# Patient Record
Sex: Male | Born: 1955
Health system: Southern US, Community
[De-identification: ages and names within clinical notes are randomized; demographics above are authoritative.]

## PROBLEM LIST (undated history)

## (undated) DIAGNOSIS — I739 Peripheral vascular disease, unspecified: Secondary | ICD-10-CM

## (undated) DIAGNOSIS — I10 Essential (primary) hypertension: Secondary | ICD-10-CM

## (undated) DIAGNOSIS — E119 Type 2 diabetes mellitus without complications: Secondary | ICD-10-CM

## (undated) DIAGNOSIS — I6529 Occlusion and stenosis of unspecified carotid artery: Secondary | ICD-10-CM

## (undated) HISTORY — DX: Peripheral vascular disease, unspecified: I73.9

## (undated) HISTORY — DX: Occlusion and stenosis of unspecified carotid artery: I65.29

---

## 2017-01-03 DIAGNOSIS — L0291 Cutaneous abscess, unspecified: Secondary | ICD-10-CM | POA: Diagnosis not present

## 2017-01-03 DIAGNOSIS — L255 Unspecified contact dermatitis due to plants, except food: Secondary | ICD-10-CM | POA: Diagnosis not present

## 2017-01-03 DIAGNOSIS — A932 Colorado tick fever: Secondary | ICD-10-CM | POA: Diagnosis not present

## 2017-01-22 DIAGNOSIS — L259 Unspecified contact dermatitis, unspecified cause: Secondary | ICD-10-CM | POA: Diagnosis not present

## 2017-02-19 DIAGNOSIS — A932 Colorado tick fever: Secondary | ICD-10-CM | POA: Diagnosis not present

## 2017-02-19 DIAGNOSIS — L309 Dermatitis, unspecified: Secondary | ICD-10-CM | POA: Diagnosis not present

## 2017-02-19 DIAGNOSIS — L259 Unspecified contact dermatitis, unspecified cause: Secondary | ICD-10-CM | POA: Diagnosis not present

## 2017-02-25 DIAGNOSIS — D485 Neoplasm of uncertain behavior of skin: Secondary | ICD-10-CM | POA: Diagnosis not present

## 2017-02-25 DIAGNOSIS — L578 Other skin changes due to chronic exposure to nonionizing radiation: Secondary | ICD-10-CM | POA: Diagnosis not present

## 2017-02-25 DIAGNOSIS — L300 Nummular dermatitis: Secondary | ICD-10-CM | POA: Diagnosis not present

## 2017-03-25 DIAGNOSIS — L300 Nummular dermatitis: Secondary | ICD-10-CM | POA: Diagnosis not present

## 2017-03-25 DIAGNOSIS — R531 Weakness: Secondary | ICD-10-CM | POA: Diagnosis not present

## 2017-05-25 DIAGNOSIS — R739 Hyperglycemia, unspecified: Secondary | ICD-10-CM | POA: Diagnosis not present

## 2017-05-25 DIAGNOSIS — Z1389 Encounter for screening for other disorder: Secondary | ICD-10-CM | POA: Diagnosis not present

## 2017-05-25 DIAGNOSIS — R21 Rash and other nonspecific skin eruption: Secondary | ICD-10-CM | POA: Diagnosis not present

## 2017-05-25 DIAGNOSIS — Z79899 Other long term (current) drug therapy: Secondary | ICD-10-CM | POA: Diagnosis not present

## 2017-05-25 DIAGNOSIS — R5383 Other fatigue: Secondary | ICD-10-CM | POA: Diagnosis not present

## 2017-07-05 DIAGNOSIS — H25813 Combined forms of age-related cataract, bilateral: Secondary | ICD-10-CM | POA: Diagnosis not present

## 2017-07-05 DIAGNOSIS — E119 Type 2 diabetes mellitus without complications: Secondary | ICD-10-CM | POA: Diagnosis not present

## 2017-07-29 DIAGNOSIS — Z6822 Body mass index (BMI) 22.0-22.9, adult: Secondary | ICD-10-CM | POA: Diagnosis not present

## 2017-07-29 DIAGNOSIS — L989 Disorder of the skin and subcutaneous tissue, unspecified: Secondary | ICD-10-CM | POA: Diagnosis not present

## 2017-07-29 DIAGNOSIS — E119 Type 2 diabetes mellitus without complications: Secondary | ICD-10-CM | POA: Diagnosis not present

## 2017-08-26 DIAGNOSIS — Z23 Encounter for immunization: Secondary | ICD-10-CM | POA: Diagnosis not present

## 2017-08-26 DIAGNOSIS — M159 Polyosteoarthritis, unspecified: Secondary | ICD-10-CM | POA: Diagnosis not present

## 2017-08-26 DIAGNOSIS — R39198 Other difficulties with micturition: Secondary | ICD-10-CM | POA: Diagnosis not present

## 2017-08-26 DIAGNOSIS — E119 Type 2 diabetes mellitus without complications: Secondary | ICD-10-CM | POA: Diagnosis not present

## 2017-10-24 DIAGNOSIS — Z1211 Encounter for screening for malignant neoplasm of colon: Secondary | ICD-10-CM | POA: Diagnosis not present

## 2017-10-24 DIAGNOSIS — R030 Elevated blood-pressure reading, without diagnosis of hypertension: Secondary | ICD-10-CM | POA: Diagnosis not present

## 2017-10-24 DIAGNOSIS — E119 Type 2 diabetes mellitus without complications: Secondary | ICD-10-CM | POA: Diagnosis not present

## 2017-12-22 DIAGNOSIS — E785 Hyperlipidemia, unspecified: Secondary | ICD-10-CM | POA: Diagnosis not present

## 2017-12-22 DIAGNOSIS — E119 Type 2 diabetes mellitus without complications: Secondary | ICD-10-CM | POA: Diagnosis not present

## 2017-12-22 DIAGNOSIS — M159 Polyosteoarthritis, unspecified: Secondary | ICD-10-CM | POA: Diagnosis not present

## 2018-01-16 DIAGNOSIS — D124 Benign neoplasm of descending colon: Secondary | ICD-10-CM | POA: Diagnosis not present

## 2018-01-16 DIAGNOSIS — K5730 Diverticulosis of large intestine without perforation or abscess without bleeding: Secondary | ICD-10-CM | POA: Diagnosis not present

## 2018-01-16 DIAGNOSIS — K635 Polyp of colon: Secondary | ICD-10-CM | POA: Diagnosis not present

## 2018-01-16 DIAGNOSIS — Z1211 Encounter for screening for malignant neoplasm of colon: Secondary | ICD-10-CM | POA: Diagnosis not present

## 2018-02-06 ENCOUNTER — Ambulatory Visit (INDEPENDENT_AMBULATORY_CARE_PROVIDER_SITE_OTHER): Payer: Commercial Managed Care - PPO

## 2018-02-06 ENCOUNTER — Telehealth: Payer: Self-pay | Admitting: *Deleted

## 2018-02-06 ENCOUNTER — Encounter: Payer: Self-pay | Admitting: Podiatry

## 2018-02-06 ENCOUNTER — Ambulatory Visit: Payer: Commercial Managed Care - PPO | Admitting: Podiatry

## 2018-02-06 VITALS — BP 149/85 | HR 76

## 2018-02-06 DIAGNOSIS — Z720 Tobacco use: Secondary | ICD-10-CM

## 2018-02-06 DIAGNOSIS — I739 Peripheral vascular disease, unspecified: Secondary | ICD-10-CM

## 2018-02-06 DIAGNOSIS — I96 Gangrene, not elsewhere classified: Secondary | ICD-10-CM | POA: Diagnosis not present

## 2018-02-06 DIAGNOSIS — Z8679 Personal history of other diseases of the circulatory system: Secondary | ICD-10-CM | POA: Diagnosis not present

## 2018-02-06 NOTE — Telephone Encounter (Signed)
-----   Message from Evelina Bucy, DPM sent at 02/06/2018  9:31 AM EDT ----- Regarding: Vascular Studies Can we order vascular studies at [facility]? Including toe pressures. Thanks!

## 2018-02-06 NOTE — Telephone Encounter (Signed)
Left message informing pt of the appt 02/15/2018 and [facility] [phone].

## 2018-02-06 NOTE — Progress Notes (Signed)
  Subjective:  Patient ID: William Shields, male    DOB: [birth year],  MRN: [id number]  Chief Complaint  Patient presents with  . Foot Pain    right foot from great toe up into the calf of leg   62 y.o. male presents with the above complaint.  States that the right great toe is turning dark/purple started to have pain up into his leg for the past year.  The toe is only been darkening for the past week or so.  Reports diabetes with daily blood sugars between 140 and 150.  Denies numbness and tingling.  Endorses a smoking.  Has attempted bupropion and Chantix to quit smoking but has never succeeded.  No past medical history on file.  Current Outpatient Medications:  .  lisinopril (PRINIVIL,ZESTRIL) 2.5 MG tablet, Take 2.5 mg by mouth daily., Disp: , Rfl: 5 .  metFORMIN (GLUCOPHAGE-XR) 500 MG 24 hr tablet, Take 500 mg by mouth 2 (two) times daily., Disp: , Rfl: 5 .  tamsulosin (FLOMAX) 0.4 MG CAPS capsule, , Disp: , Rfl:   Allergies  Allergen Reactions  . Pollen Extract    Review of Systems: Negative except as noted in the HPI. Denies N/V/F/Ch. Objective:   Vitals:   02/06/18 0921  BP: (!) 149/85  Pulse: 76   General AA&O x3. Normal mood and affect.  Vascular Dorsalis pedis and posterior tibial pulses faint.  Foot warm but decreased pressure noted Capillary refill delayed to the right hallux approximately 5 seconds Pedal hair growth diminished.  Neurologic Epicritic sensation grossly present.  Dermatologic No open lesions. Interspaces clear of maceration. Nails well groomed and normal in appearance.  Orthopedic: MMT 5/5 in dorsiflexion, plantarflexion, inversion, and eversion. Normal joint ROM without pain or crepitus.   Assessment & Plan:  Patient was evaluated and treated and all questions answered.  Peripheral arterial disease with intermittent claudication -Order for stat vascular studies. -Recommend baby aspirin daily -Should vascular studies warrant would provide referral  to interventionalist  Tobacco use disorder -3 minutes spent discussing importance of smoking cessation for overall but more importantly pedal health.  Discussed the risk of continued smoking on decreased pedal perfusion and possible limb loss.  Discussed follow-up with PCP for further help with smoking cessation, though he has failed bupropion and Chantix already.  No follow-ups on file.

## 2018-02-06 NOTE — Telephone Encounter (Signed)
[facility] scheduled dopplers for 02/15/2018 arrive 7:30am for 8:00am testing. Faxed orders to [facility].

## 2018-02-09 ENCOUNTER — Telehealth: Payer: Self-pay | Admitting: Podiatry

## 2018-02-15 DIAGNOSIS — I96 Gangrene, not elsewhere classified: Secondary | ICD-10-CM | POA: Diagnosis not present

## 2018-02-15 DIAGNOSIS — I70213 Atherosclerosis of native arteries of extremities with intermittent claudication, bilateral legs: Secondary | ICD-10-CM | POA: Diagnosis not present

## 2018-02-15 DIAGNOSIS — I771 Stricture of artery: Secondary | ICD-10-CM | POA: Diagnosis not present

## 2018-02-16 ENCOUNTER — Encounter: Payer: Self-pay | Admitting: Podiatry

## 2018-02-20 ENCOUNTER — Ambulatory Visit: Payer: Commercial Managed Care - PPO | Admitting: Podiatry

## 2018-02-20 ENCOUNTER — Encounter: Payer: Self-pay | Admitting: *Deleted

## 2018-02-20 DIAGNOSIS — Z720 Tobacco use: Secondary | ICD-10-CM

## 2018-02-20 DIAGNOSIS — I739 Peripheral vascular disease, unspecified: Secondary | ICD-10-CM

## 2018-02-20 NOTE — Progress Notes (Signed)
  Subjective:  Patient ID: William Shields, male    DOB: January 02, 1956,  MRN: [id number]  Chief Complaint  Patient presents with  . PAD    F/U Pt. stated," aspirin keeping my BP under control, but my foot pain is about the same when I do house work; 10/10 achy pain Tx: aspirin   62 y.o. male presents with the above complaint. Tried to quit smoking for 3 days but states his BS and BP went up and he went back to smoking. Pain unchanged.  Allergies  Allergen Reactions  . Pollen Extract    Review of Systems: Negative except as noted in the HPI. Denies N/V/F/Ch. Objective:   There were no vitals filed for this visit. General AA&O x3. Normal mood and affect.  Vascular Dorsalis pedis and posterior tibial pulses faint.  Foot warm but decreased pressure noted Capillary refill delayed to the right hallux approximately 5 seconds Pedal hair growth diminished.  Neurologic Epicritic sensation grossly present.  Dermatologic No open lesions. Interspaces clear of maceration. Nails well groomed and normal in appearance.  Orthopedic: MMT 5/5 in dorsiflexion, plantarflexion, inversion, and eversion. Normal joint ROM without pain or crepitus.   Assessment & Plan:  Patient was evaluated and treated and all questions answered.  Peripheral arterial disease with intermittent claudication -ABIs/PVRs reviewed. SFA disease R. Decreased toe pressures bilat, flatline pressure L -Would benefit from eval for angio. Will refer to Dr. Corrie Mckusick for eval. -F/u in 1 month. -Encouraged continued smoking cessation.   No follow-ups on file.

## 2018-02-21 ENCOUNTER — Other Ambulatory Visit: Payer: Self-pay | Admitting: Podiatry

## 2018-02-21 ENCOUNTER — Telehealth: Payer: Self-pay | Admitting: *Deleted

## 2018-02-21 DIAGNOSIS — R0989 Other specified symptoms and signs involving the circulatory and respiratory systems: Secondary | ICD-10-CM

## 2018-02-21 DIAGNOSIS — I739 Peripheral vascular disease, unspecified: Secondary | ICD-10-CM

## 2018-02-21 NOTE — Telephone Encounter (Signed)
-----   Message from Evelina Bucy, DPM sent at 02/20/2018  8:55 AM EDT ----- Regarding: Referral to Dr. Earleen Newport Can we get pt appt with Dr. Corrie Mckusick for possible Angio?

## 2018-02-21 NOTE — Telephone Encounter (Signed)
lmom to sch consult with Dr Earleen Newport.Cathren Harsh

## 2018-02-21 NOTE — Telephone Encounter (Signed)
[facility]Basket orders to [facility] and faxed to [facility].

## 2018-03-02 ENCOUNTER — Other Ambulatory Visit: Payer: Self-pay | Admitting: *Deleted

## 2018-03-02 ENCOUNTER — Ambulatory Visit
Admission: RE | Admit: 2018-03-02 | Discharge: 2018-03-02 | Disposition: A | Discharge status: Home / Self Care | Payer: Self-pay | Source: Ambulatory Visit | Attending: Podiatry | Admitting: Podiatry

## 2018-03-02 DIAGNOSIS — I739 Peripheral vascular disease, unspecified: Secondary | ICD-10-CM

## 2018-03-02 DIAGNOSIS — R0989 Other specified symptoms and signs involving the circulatory and respiratory systems: Secondary | ICD-10-CM

## 2018-03-02 HISTORY — PX: IR RADIOLOGIST EVAL & MGMT: IMG5224

## 2018-03-02 HISTORY — DX: Peripheral vascular disease, unspecified: I73.9

## 2018-03-02 NOTE — Consult Note (Signed)
Chief Complaint: I have leg pain  Referring Physician(s): Price,Michael J  PCP: Jeanie Sewer, [facility], Internal Medicine in [place]  History of Present Illness: William Shields is a 62 y.o. male presenting today as a scheduled consultation to Vascular & Interventional Radiology, kindly referred by Dr. Hardie Pulley of [facility], for evaluation of symptomatic PAD.    William Shields is here today by himself. He tells me that he has had worsening right leg pain over the course of several months, which started around December.  He also has noticed that his right great toe has been changing color, specifically purplish-black. This is what brought him to Dr. Eleanora Neighbor office.    He has not had any wound of the legs.  His left leg symptoms of pain are more mild.  His right leg symptoms are present with any short distance walking, and include very painful cramping of the calf and the thigh.  This usually goes away with resting, and he denies any resting pain or night pain that wakes him.    He continues to work, and has no intention of retiring, as he supports both his wife and his adult son, who he tells me is HIV+ and live at home.     He finds that day to day activities are very difficult with his leg pain, which is very restrictive.    He tells me he has been treated for a mild stroke, and in fact has imaging showing a right ICA occlusion.  He tells me he was told he had a "mild" heart attack, though was treated only medically, and denies any ongoing chest pain.      CV risk factors include DM, HTN, smoking, known vascular disease/CAD/CVD.   Past Medical History:  Diagnosis Date  . PAD (peripheral artery disease) (HCC)      Allergies: Pollen extract  Medications: Prior to Admission medications   Medication Sig Start Date End Date Taking? Authorizing Provider  aspirin 81 MG tablet Take 81 mg by mouth daily.   Yes [provider]  lisinopril  (PRINIVIL,ZESTRIL) 2.5 MG tablet Take 2.5 mg by mouth daily. 02/03/18  Yes [provider]  metFORMIN (GLUCOPHAGE-XR) 500 MG 24 hr tablet Take 500 mg by mouth 2 (two) times daily. 01/28/18  Yes [provider]  tamsulosin (FLOMAX) 0.4 MG CAPS capsule  01/19/18  Yes [provider]     No family history on file.  Social History   Socioeconomic History  . Marital status: Married    Spouse name: Not on file  . Number of children: Not on file  . Years of education: Not on file  . Highest education level: Not on file  Occupational History  . Not on file  Social Needs  . Financial resource strain: Not on file  . Food insecurity:    Worry: Not on file    Inability: Not on file  . Transportation needs:    Medical: Not on file    Non-medical: Not on file  Tobacco Use  . Smoking status: Current Every Day Smoker    Packs/day: 1.00    Years: 40.00    Pack years: 40.00    Types: Cigarettes  . Smokeless tobacco: Never Used  Substance and Sexual Activity  . Alcohol use: Yes    Comment: occs.  . Drug use: Never  . Sexual activity: Not on file  Lifestyle  . Physical activity:    Days per week: Not on  file    Minutes per session: Not on file  . Stress: Not on file  Relationships  . Social connections:    Talks on phone: Not on file    Gets together: Not on file    Attends religious service: Not on file    Active member of club or organization: Not on file    Attends meetings of clubs or organizations: Not on file    Relationship status: Not on file  Other Topics Concern  . Not on file  Social History Narrative  . Not on file     Review of Systems: A 12 point ROS discussed and pertinent positives are indicated in the HPI above.  All other systems are negative.  Review of Systems  Vital Signs: BP (!) 133/58   Pulse 79   Temp 98.1 F (36.7 C) (Oral)   Resp 15   Ht 5\' 10"  (1.778 m)   Wt 165 lb (74.8 kg)   SpO2 98%   BMI 23.68 kg/m   Physical  Exam General: 62 yo male appearing  stated age.  Well-developed, well-nourished.  No distress. HEENT: Atraumatic, normocephalic.  Conjugate gaze, extra-ocular motor intact. Glasses. No scleral icterus or scleral injection. No lesions on external ears, nose, lips, or gums.  Oral mucosa moist, pink.  Neck: Symmetric with no goiter enlargement.  Chest/Lungs:  Symmetric chest with inspiration/expiration.  No labored breathing.  Clear to auscultation with no wheezes, rhonchi, or rales.  Heart:  RRR, with no third heart sounds appreciated. No JVD appreciated.  Abdomen:  Soft, NT/ND, with + bowel sounds.   Genito-urinary: Deferred Neurologic: Alert & Oriented to person, place, and time.   Normal affect and insight.  Appropriate questions.  Moving all 4 extremities with gross sensory intact.  Pulse Exam:  No bruit appreciated on left or right.  No palpable pulsatile abdominal mass.  Palpable right CFA & left CFA.   Non-palpable right and left PT/DP.  He has doppler + signal on the right DP and PT.  Strong doppler + signal on the left DP and PT.Marland Kitchen   Extremities: Hairless lower extremity. No wounds on the feet.  His right great toe has some discoloration with redish-purplish hue.    Imaging: Dg Foot Complete Right  Result Date: 02/06/2018 Please see detailed radiograph report in office note.  Ir Radiologist Eval & Mgmt  Result Date: 03/02/2018 Please refer to notes tab for details about interventional procedure. (Op Note)   Labs:  CBC: No results for input(s): WBC, HGB, HCT, PLT in the last 8760 hours.  COAGS: No results for input(s): INR, APTT in the last 8760 hours.  BMP: No results for input(s): NA, K, CL, CO2, GLUCOSE, BUN, CALCIUM, CREATININE, GFRNONAA, GFRAA in the last 8760 hours.  Invalid input(s): CMP  LIVER FUNCTION TESTS: No results for input(s): BILITOT, AST, ALT, ALKPHOS, PROT, ALBUMIN in the last 8760 hours.  TUMOR MARKERS: No results for input(s): AFPTM, CEA, CA199,  CHROMGRNA in the last 8760 hours.  Assessment and Plan:  Assessment:  William Shields is a 35 presenting with severe, life-style limiting, short-distance claudication of the right leg, compatible with Rutherford 3 class symptoms, significantly restricting his work and life.  He also has color changes of his right great toe without ulceration, potentially related to an embolic event versus worsening ischemia.    Right resting ABI is 0.67, and left is 0.89.  I suspect that an exercise exam would show a significant decrease in the values, as the  non-invasive lower extremity exam shows evidence of aorto-iliac and femoral-popliteal disease of the right leg.  Changes are more mild on the left.    I had a lengthy discussion regarding anatomy, pathology/pathophysiology, natural history, and prognosis of PAD/CLI.  Informed consent regarding treatment strategies was performed which would possibly include medical management and a walking program.  He was very explicit that he was not interested in conservative management with an exercise program, given that his symptoms are severly limiting his working ability.    I further discussed surgical strategy and/or endovascular options, with risk/benefit discussion.  Our indications for treatment supported by updated guidelines1, 2 were discussed.  Given the presence of diabetes, healthy foot care was also discussed, in accord with multi-disciplinary, Class 1 recommendations.1,3 Current recommendations advocate daily foot inspection, good nail care, avoiding barefoot walking, properly fitted footwear, seeking care with problems, and at least annual inspection3.     Patient has elected to proceed with endovascular options.   I think this is very reasonable given that there is a chance that the toe changes may be related to embolic event.    Regarding endovascular options, specific risks discussed include: bleeding, infection, contrast reaction, renal injury/nephropathy,  arterial injury/dissection, need for additional procedure/surgery, worsening symptoms/tissue including limb loss, cardiopulmonary collapse, death.    Regarding medical management, maximal medical therapy for reduction of risk factors is indicated as recommended by updated AHA guidelines1.  This includes anti-platelet medication, tight blood glucose control to a HbA1c < 7, tight blood pressure control, maximum-dose HMG-CoA reductase inhibitor, and smoking cessation.  Smoking cessation was addressed, with strategies including counseling and nicotine replacement.  He expressed interest in quitting, but he does not have a specific strategy.  He has failed a prior attempt at quitting.    Plan: -Cross-sectional anatomic imaging with CTA run-off  for pre-operative planning. - Tentative plan is to proceed with aorto-peripheral angiogram and possible intervention, and we will follow up with him once the CT is performed -Recommend continued maximal medical therapy for cardiovascular risk reduction, including anti-platelet therapy.  He is not yet on cholesterol mediation, which is something that he would likely benefit from.  -Recommend smoking cessation measures -Observe healthy foot care habits, given the presence of diabetes, with at least annual foot inspection performed in the setting of DM.    -Annual flu vaccination is recommended in the setting of known PAD, in the absence of contra-indications.   Signed,  Dulcy Fanny. Earleen Newport, DO     ________________________________________________________   1Morley Kos MD, et al. 2016 AHA/ACC Guideline on the Management of Patients With Lower Extremity Peripheral Artery Disease: Executive Summary: A Report of the American College of Cardiology/American Heart Association Task Force on Clinical Practice Guidelines. J Am Coll Cardiol. 2017 Mar 21;69(11):1465-1508. doi: 10.1016/j.jacc.2016.11.008.   2 - Norgren L, et al. TASC II Working Group. Inter-society  consensus for the management of peripheral arterial disease. Int Tressia Miners. 2007 Jun;26(2):81-157. Review. PubMed PMID: 01601093  3 - Hingorani A, et al. The management of diabetic foot: A clinical practice guideline by the Society for Vascular Surgery in collaboration with the [place] and the Society  for Vascular Medicine. J Vasc Surg. 2016 Feb;63(2 Suppl):3S-21S. doi: 10.1016/j.jvs.2015.10.003. PubMed PMID: 23557322.   Thank you for this interesting consult.  I greatly enjoyed meeting Jud Gatchalian and look forward to participating in their care.  A copy of this report was sent to the requesting provider on this date.  Electronically Signed: Corrie Mckusick 03/02/2018, 11:15  AM   I spent a total of  40 Minutes   in face to face in clinical consultation, greater than 50% of which was counseling/coordinating care for PAD, possible angiogram/intervention

## 2018-03-06 DIAGNOSIS — I739 Peripheral vascular disease, unspecified: Secondary | ICD-10-CM | POA: Diagnosis not present

## 2018-03-06 DIAGNOSIS — K5730 Diverticulosis of large intestine without perforation or abscess without bleeding: Secondary | ICD-10-CM | POA: Diagnosis not present

## 2018-03-15 ENCOUNTER — Encounter: Payer: Self-pay | Admitting: Radiology

## 2018-03-15 ENCOUNTER — Telehealth: Payer: Self-pay | Admitting: *Deleted

## 2018-03-15 NOTE — Progress Notes (Signed)
Per Dr Corrie Mckusick:   Future IR procedures and follow ups should be scheduled at [facility], [place], [place].    Bettie Capistran Riki Rusk, RN 03/15/2018 10:35 AM

## 2018-03-20 DIAGNOSIS — E119 Type 2 diabetes mellitus without complications: Secondary | ICD-10-CM | POA: Diagnosis not present

## 2018-03-20 DIAGNOSIS — I10 Essential (primary) hypertension: Secondary | ICD-10-CM | POA: Diagnosis not present

## 2018-03-20 DIAGNOSIS — E785 Hyperlipidemia, unspecified: Secondary | ICD-10-CM | POA: Diagnosis not present

## 2018-03-27 ENCOUNTER — Ambulatory Visit: Payer: Commercial Managed Care - PPO | Admitting: Podiatry

## 2018-03-28 DIAGNOSIS — I70292 Other atherosclerosis of native arteries of extremities, left leg: Secondary | ICD-10-CM | POA: Diagnosis not present

## 2018-03-28 DIAGNOSIS — Z79899 Other long term (current) drug therapy: Secondary | ICD-10-CM | POA: Diagnosis not present

## 2018-03-28 DIAGNOSIS — I739 Peripheral vascular disease, unspecified: Secondary | ICD-10-CM | POA: Diagnosis not present

## 2018-03-28 DIAGNOSIS — I70291 Other atherosclerosis of native arteries of extremities, right leg: Secondary | ICD-10-CM | POA: Diagnosis not present

## 2018-03-28 DIAGNOSIS — M79604 Pain in right leg: Secondary | ICD-10-CM | POA: Diagnosis not present

## 2018-05-22 DIAGNOSIS — I739 Peripheral vascular disease, unspecified: Secondary | ICD-10-CM | POA: Diagnosis not present

## 2018-05-22 DIAGNOSIS — Z8679 Personal history of other diseases of the circulatory system: Secondary | ICD-10-CM | POA: Diagnosis not present

## 2018-05-22 DIAGNOSIS — Z9889 Other specified postprocedural states: Secondary | ICD-10-CM | POA: Diagnosis not present

## 2018-06-19 DIAGNOSIS — E119 Type 2 diabetes mellitus without complications: Secondary | ICD-10-CM | POA: Diagnosis not present

## 2018-06-19 DIAGNOSIS — E785 Hyperlipidemia, unspecified: Secondary | ICD-10-CM | POA: Diagnosis not present

## 2018-06-19 DIAGNOSIS — I10 Essential (primary) hypertension: Secondary | ICD-10-CM | POA: Diagnosis not present

## 2018-09-19 DIAGNOSIS — E119 Type 2 diabetes mellitus without complications: Secondary | ICD-10-CM | POA: Diagnosis not present

## 2018-09-19 DIAGNOSIS — I10 Essential (primary) hypertension: Secondary | ICD-10-CM | POA: Diagnosis not present

## 2018-09-19 DIAGNOSIS — E785 Hyperlipidemia, unspecified: Secondary | ICD-10-CM | POA: Diagnosis not present

## 2018-10-03 DIAGNOSIS — E875 Hyperkalemia: Secondary | ICD-10-CM | POA: Diagnosis not present

## 2018-10-30 DIAGNOSIS — C44229 Squamous cell carcinoma of skin of left ear and external auricular canal: Secondary | ICD-10-CM | POA: Diagnosis not present

## 2018-10-30 DIAGNOSIS — C4442 Squamous cell carcinoma of skin of scalp and neck: Secondary | ICD-10-CM | POA: Diagnosis not present

## 2018-10-30 DIAGNOSIS — L570 Actinic keratosis: Secondary | ICD-10-CM | POA: Diagnosis not present

## 2018-11-14 DIAGNOSIS — I739 Peripheral vascular disease, unspecified: Secondary | ICD-10-CM | POA: Diagnosis not present

## 2018-11-14 DIAGNOSIS — Z872 Personal history of diseases of the skin and subcutaneous tissue: Secondary | ICD-10-CM | POA: Diagnosis not present

## 2018-11-14 DIAGNOSIS — C4442 Squamous cell carcinoma of skin of scalp and neck: Secondary | ICD-10-CM | POA: Diagnosis not present

## 2019-10-16 DIAGNOSIS — I10 Essential (primary) hypertension: Secondary | ICD-10-CM | POA: Diagnosis not present

## 2019-10-16 DIAGNOSIS — E785 Hyperlipidemia, unspecified: Secondary | ICD-10-CM | POA: Diagnosis not present

## 2019-10-16 DIAGNOSIS — Z1331 Encounter for screening for depression: Secondary | ICD-10-CM | POA: Diagnosis not present

## 2019-10-16 DIAGNOSIS — E119 Type 2 diabetes mellitus without complications: Secondary | ICD-10-CM | POA: Diagnosis not present

## 2019-11-03 ENCOUNTER — Encounter (HOSPITAL_COMMUNITY): Payer: Self-pay | Admitting: *Deleted

## 2019-11-03 ENCOUNTER — Other Ambulatory Visit: Payer: Self-pay

## 2019-11-03 ENCOUNTER — Emergency Department (HOSPITAL_COMMUNITY): Payer: BC Managed Care – PPO

## 2019-11-03 ENCOUNTER — Inpatient Hospital Stay (HOSPITAL_COMMUNITY)
Admission: EM | Admit: 2019-11-03 | Discharge: 2019-11-06 | DRG: 254 | Disposition: A | Discharge status: Home / Self Care | Payer: BC Managed Care – PPO | Source: Ambulatory Visit | Attending: Internal Medicine | Admitting: Internal Medicine

## 2019-11-03 DIAGNOSIS — E1151 Type 2 diabetes mellitus with diabetic peripheral angiopathy without gangrene: Principal | ICD-10-CM | POA: Diagnosis present

## 2019-11-03 DIAGNOSIS — I70212 Atherosclerosis of native arteries of extremities with intermittent claudication, left leg: Secondary | ICD-10-CM | POA: Diagnosis present

## 2019-11-03 DIAGNOSIS — E139 Other specified diabetes mellitus without complications: Secondary | ICD-10-CM

## 2019-11-03 DIAGNOSIS — I743 Embolism and thrombosis of arteries of the lower extremities: Secondary | ICD-10-CM

## 2019-11-03 DIAGNOSIS — I75022 Atheroembolism of left lower extremity: Secondary | ICD-10-CM | POA: Diagnosis not present

## 2019-11-03 DIAGNOSIS — Z7982 Long term (current) use of aspirin: Secondary | ICD-10-CM | POA: Diagnosis not present

## 2019-11-03 DIAGNOSIS — R230 Cyanosis: Secondary | ICD-10-CM | POA: Diagnosis not present

## 2019-11-03 DIAGNOSIS — I739 Peripheral vascular disease, unspecified: Secondary | ICD-10-CM | POA: Diagnosis present

## 2019-11-03 DIAGNOSIS — I10 Essential (primary) hypertension: Secondary | ICD-10-CM | POA: Diagnosis present

## 2019-11-03 DIAGNOSIS — Z20822 Contact with and (suspected) exposure to covid-19: Secondary | ICD-10-CM | POA: Diagnosis present

## 2019-11-03 DIAGNOSIS — F17210 Nicotine dependence, cigarettes, uncomplicated: Secondary | ICD-10-CM | POA: Diagnosis present

## 2019-11-03 DIAGNOSIS — I75021 Atheroembolism of right lower extremity: Secondary | ICD-10-CM | POA: Diagnosis not present

## 2019-11-03 DIAGNOSIS — I75029 Atheroembolism of unspecified lower extremity: Secondary | ICD-10-CM

## 2019-11-03 DIAGNOSIS — I745 Embolism and thrombosis of iliac artery: Secondary | ICD-10-CM | POA: Diagnosis not present

## 2019-11-03 DIAGNOSIS — Z8349 Family history of other endocrine, nutritional and metabolic diseases: Secondary | ICD-10-CM

## 2019-11-03 DIAGNOSIS — Z03818 Encounter for observation for suspected exposure to other biological agents ruled out: Secondary | ICD-10-CM | POA: Diagnosis not present

## 2019-11-03 DIAGNOSIS — Z720 Tobacco use: Secondary | ICD-10-CM | POA: Diagnosis present

## 2019-11-03 DIAGNOSIS — M79675 Pain in left toe(s): Secondary | ICD-10-CM | POA: Diagnosis not present

## 2019-11-03 DIAGNOSIS — Z8249 Family history of ischemic heart disease and other diseases of the circulatory system: Secondary | ICD-10-CM

## 2019-11-03 DIAGNOSIS — I708 Atherosclerosis of other arteries: Secondary | ICD-10-CM | POA: Diagnosis not present

## 2019-11-03 HISTORY — DX: Type 2 diabetes mellitus without complications: E11.9

## 2019-11-03 HISTORY — DX: Essential (primary) hypertension: I10

## 2019-11-03 LAB — CBC
HCT: 43.8 % (ref 39.0–52.0)
Hemoglobin: 14.6 g/dL (ref 13.0–17.0)
MCH: 31.9 pg (ref 26.0–34.0)
MCHC: 33.3 g/dL (ref 30.0–36.0)
MCV: 95.6 fL (ref 80.0–100.0)
Platelets: 253 10*3/uL (ref 150–400)
RBC: 4.58 MIL/uL (ref 4.22–5.81)
RDW: 11.9 % (ref 11.5–15.5)
WBC: 11 10*3/uL — ABNORMAL HIGH (ref 4.0–10.5)
nRBC: 0 % (ref 0.0–0.2)

## 2019-11-03 LAB — CBG MONITORING, ED: Glucose-Capillary: 109 mg/dL — ABNORMAL HIGH (ref 70–99)

## 2019-11-03 LAB — COMPREHENSIVE METABOLIC PANEL
ALT: 27 U/L (ref 0–44)
AST: 20 U/L (ref 15–41)
Albumin: 4.2 g/dL (ref 3.5–5.0)
Alkaline Phosphatase: 67 U/L (ref 38–126)
Anion gap: 8 (ref 5–15)
BUN: 15 mg/dL (ref 8–23)
CO2: 24 mmol/L (ref 22–32)
Calcium: 9.9 mg/dL (ref 8.9–10.3)
Chloride: 109 mmol/L (ref 98–111)
Creatinine, Ser: 1.21 mg/dL (ref 0.61–1.24)
GFR calc Af Amer: >60 mL/min (ref >60)
GFR calc non Af Amer: >60 mL/min (ref >60)
Glucose, Bld: 107 mg/dL — ABNORMAL HIGH (ref 70–99)
Potassium: 4.7 mmol/L (ref 3.5–5.1)
Sodium: 141 mmol/L (ref 135–145)
Total Bilirubin: 0.8 mg/dL (ref 0.3–1.2)
Total Protein: 6.7 g/dL (ref 6.5–8.1)

## 2019-11-03 LAB — URINALYSIS, ROUTINE W REFLEX MICROSCOPIC
Bilirubin Urine: NEGATIVE
Glucose, UA: NEGATIVE mg/dL
Hgb urine dipstick: NEGATIVE
Ketones, ur: NEGATIVE mg/dL
Leukocytes,Ua: NEGATIVE
Nitrite: NEGATIVE
Protein, ur: NEGATIVE mg/dL
Specific Gravity, Urine: 1.012 (ref 1.005–1.030)
pH: 6 (ref 5.0–8.0)

## 2019-11-03 LAB — HEMOGLOBIN A1C
Hgb A1c MFr Bld: 7.1 % — ABNORMAL HIGH (ref 4.8–5.6)
Mean Plasma Glucose: 157.07 mg/dL

## 2019-11-03 LAB — PROTIME-INR
INR: 0.9 (ref 0.8–1.2)
Prothrombin Time: 12.2 seconds (ref 11.4–15.2)

## 2019-11-03 LAB — GLUCOSE, CAPILLARY: Glucose-Capillary: 138 mg/dL — ABNORMAL HIGH (ref 70–99)

## 2019-11-03 LAB — APTT: aPTT: 28 seconds (ref 24–36)

## 2019-11-03 MED ORDER — INSULIN ASPART 100 UNIT/ML ~~LOC~~ SOLN
0.0000 [IU] | Freq: Three times a day (TID) | SUBCUTANEOUS | Status: DC
  Administered 2019-11-04: 2 [IU] via SUBCUTANEOUS

## 2019-11-03 MED ORDER — ACETAMINOPHEN 325 MG PO TABS: 650.0000 mg | ORAL_TABLET | Freq: Four times a day (QID) | ORAL | Status: DC | PRN

## 2019-11-03 MED ORDER — ASPIRIN EC 81 MG PO TBEC
81.0000 mg | DELAYED_RELEASE_TABLET | Freq: Every day | ORAL | Status: DC
  Administered 2019-11-03 – 2019-11-05 (×2): 81 mg via ORAL
  Filled 2019-11-03 (×2): qty 1

## 2019-11-03 MED ORDER — SODIUM CHLORIDE 0.9% FLUSH: 3.0000 mL | Freq: Once | INTRAVENOUS | Status: DC

## 2019-11-03 MED ORDER — HEPARIN BOLUS VIA INFUSION
4500.0000 [IU] | Freq: Once | INTRAVENOUS | Status: AC
  Administered 2019-11-03: 4500 [IU] via INTRAVENOUS
  Filled 2019-11-03: qty 4500

## 2019-11-03 MED ORDER — KETOROLAC TROMETHAMINE 15 MG/ML IJ SOLN
15.0000 mg | Freq: Four times a day (QID) | INTRAMUSCULAR | Status: DC | PRN
  Administered 2019-11-04 (×2): 15 mg via INTRAVENOUS
  Filled 2019-11-03 (×2): qty 1

## 2019-11-03 MED ORDER — LISINOPRIL 2.5 MG PO TABS: 2.5000 mg | ORAL_TABLET | Freq: Every day | ORAL | Status: DC

## 2019-11-03 MED ORDER — TAMSULOSIN HCL 0.4 MG PO CAPS
0.4000 mg | ORAL_CAPSULE | Freq: Every day | ORAL | Status: DC
Start: 2019-11-04 — End: 2019-11-06
  Administered 2019-11-05 – 2019-11-06 (×2): 0.4 mg via ORAL
  Filled 2019-11-03 (×2): qty 1

## 2019-11-03 MED ORDER — HEPARIN (PORCINE) 25000 UT/250ML-% IV SOLN
1400.0000 [IU]/h | INTRAVENOUS | Status: AC
  Administered 2019-11-03 – 2019-11-04 (×2): 1200 [IU]/h via INTRAVENOUS
  Filled 2019-11-03 (×3): qty 250

## 2019-11-03 MED ORDER — SODIUM CHLORIDE 0.9% FLUSH: 3.0000 mL | INTRAVENOUS | Status: DC | PRN

## 2019-11-03 MED ORDER — ACETAMINOPHEN 650 MG RE SUPP: 650.0000 mg | Freq: Four times a day (QID) | RECTAL | Status: DC | PRN

## 2019-11-03 MED ORDER — LOSARTAN POTASSIUM 50 MG PO TABS
25.0000 mg | ORAL_TABLET | Freq: Every day | ORAL | Status: DC
  Administered 2019-11-05 – 2019-11-06 (×2): 25 mg via ORAL
  Filled 2019-11-03 (×2): qty 1

## 2019-11-03 MED ORDER — NICOTINE 21 MG/24HR TD PT24
21.0000 mg | MEDICATED_PATCH | Freq: Every day | TRANSDERMAL | Status: DC
  Administered 2019-11-04 – 2019-11-05 (×3): 21 mg via TRANSDERMAL
  Filled 2019-11-03 (×4): qty 1

## 2019-11-03 MED ORDER — IOHEXOL 350 MG/ML SOLN
100.0000 mL | Freq: Once | INTRAVENOUS | Status: AC | PRN
  Administered 2019-11-03: 100 mL via INTRAVENOUS

## 2019-11-03 MED ORDER — SODIUM CHLORIDE 0.9% FLUSH
3.0000 mL | Freq: Two times a day (BID) | INTRAVENOUS | Status: DC
  Administered 2019-11-04 – 2019-11-05 (×4): 3 mL via INTRAVENOUS

## 2019-11-03 MED ORDER — SODIUM CHLORIDE 0.9 % IV SOLN
250.0000 mL | INTRAVENOUS | Status: DC | PRN
  Administered 2019-11-04: 250 mL via INTRAVENOUS

## 2019-11-03 NOTE — Progress Notes (Addendum)
ANTICOAGULATION CONSULT NOTE - Initial Consult  Pharmacy Consult for Heparin Indication: ischemic toe  Allergies  Allergen Reactions  . Pollen Extract     Patient Measurements: Height: 5\' 10"  (177.8 cm) Weight: 166 lb (75.3 kg) IBW/kg (Calculated) : 73 Heparin Dosing Weight: 75.3 kg  Vital Signs: Temp: 98.7 F (37.1 C) (02/27 1756) Temp Source: Oral (02/27 1756) BP: 159/73 (02/27 1756) Pulse Rate: 74 (02/27 1756)  Labs: Recent Labs    11/03/19 1804  HGB 14.6  HCT 43.8  PLT 253  APTT 28  LABPROT 12.2  INR 0.9  CREATININE 1.21    Estimated Creatinine Clearance: 65.4 mL/min (by C-G formula based on SCr of 1.21 mg/dL).   Medical History: Past Medical History:  Diagnosis Date  . Diabetes mellitus without complication ([place])   . Hypertension   . PAD (peripheral artery disease) (HCC)     Medications:  Scheduled:  . aspirin EC  81 mg Oral Daily  . heparin  4,500 Units Intravenous Once  . [START ON 11/04/2019] insulin aspart  0-15 Units Subcutaneous TID WC  . lisinopril  2.5 mg Oral Daily  . sodium chloride flush  3 mL Intravenous Once  . sodium chloride flush  3 mL Intravenous Q12H  . tamsulosin  0.4 mg Oral Daily    Assessment: Patient is a 89 yom that has a RLE claudication. Patient has a hx of previous claudications and apparently has been off his statin therapy. Pharmacy has been asked to dose heparin at this time for an acute ischemic limb.   Goal of Therapy:  Heparin level 0.3-0.7 units/ml Monitor platelets by anticoagulation protocol: Yes   Plan:  - Heparin 4500 units IV x 1 dose  - Followed by Heparin 1200 units/hr  - Heparin level in ~ 6 hours  - Monitor patient for s/s of bleeding and [place] PharmD. BCPS  11/03/2019,9:15 PM

## 2019-11-03 NOTE — ED Triage Notes (Signed)
The pt has had lt great toe discoloration intermittent  For 2-3 days  He was sent here from urgent care in [place] today.  The pt has had blockages in both his groins and legs in the past and had surgery the last timw 3 years ago.  The toe appears normal color then turns dusky as he moves or positions his lt leg  Otherwise the foot temperature is normal color

## 2019-11-03 NOTE — ED Notes (Signed)
Pt went to c-t 

## 2019-11-03 NOTE — ED Provider Notes (Addendum)
[place] EMERGENCY DEPARTMENT Provider Note   CSN: [id number] Arrival date & time: 11/03/19  1749     History Chief Complaint  Patient presents with  . poor circulation lt great toe    William Shields is a 64 y.o. male.  HPI   Patient is a 64 year old male with a history of diabetes, hypertension, peripheral arterial disease, who presents to the emergency department today for evaluation of pain to the left great toe.  States he has had pain for the last 2 to 3 days.  Initially it started out as itching but now it has progressed to pain.  Rates pain 4/10.  It is constant.  He also noticed some discoloration to the toe that darted a few days ago.  He does report he has had some vascular surgeries in the past about 2 years ago at [facility].  He has no other complaints at this time.  Pt previously underwent balloon angioplasty of a high-grade stenosis left external iliac artery, balloon angioplasty of a critical stenosis of right external iliac artery and treatment of right SFA disease with directional atherectomy and dcb pta.  Past Medical History:  Diagnosis Date  . Diabetes mellitus without complication ([place])   . Hypertension   . PAD (peripheral artery disease) [facility])     Patient Active Problem List   Diagnosis Date Noted  . 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 IR RADIOLOGIST EVAL & MGMT  03/02/2018       No family history on file.  Social History   Tobacco Use  . Smoking status: Current Every Day Smoker    Packs/day: 1.00    Years: 40.00    Pack years: 40.00    Types: Cigarettes  . Smokeless tobacco: Never Used  Substance Use Topics  . Alcohol use: Yes    Comment: occs.  . Drug use: Never    Home Medications Prior to Admission medications   Medication Sig Start Date End Date Taking? Authorizing Provider  aspirin 81 MG tablet Take 81 mg by mouth daily.     [provider]  lisinopril (PRINIVIL,ZESTRIL) 2.5 MG tablet Take 2.5 mg by mouth daily. 02/03/18   [provider]  metFORMIN (GLUCOPHAGE-XR) 500 MG 24 hr tablet Take 500 mg by mouth 2 (two) times daily. 01/28/18   [provider]  tamsulosin (FLOMAX) 0.4 MG CAPS capsule  01/19/18   [provider]    Allergies    Pollen extract  Review of Systems   Review of Systems  Constitutional: Negative for chills and fever.  HENT: Negative for ear pain and sore throat.   Eyes: Negative for pain and visual disturbance.  Respiratory: Negative for cough and shortness of breath.   Cardiovascular: Negative for chest pain.  Gastrointestinal: Negative for abdominal pain, constipation, diarrhea, nausea and vomiting.  Genitourinary: Negative for dysuria.  Musculoskeletal:       Left toe pain  Skin: Positive for color change.  Neurological: Negative for headaches.  All other systems reviewed and are negative.   Physical Exam Updated Vital Signs BP (!) 159/73 (BP Location: Right Arm)   Pulse 74   Temp 98.7 F (37.1 C) (Oral)   Resp 20   Ht 5\' 10"  (1.778 m)   Wt 75.3 kg   SpO2 99%   BMI 23.82 kg/m   Physical Exam Vitals and nursing note reviewed.  Constitutional:  Appearance: He is well-developed.  HENT:     Head: Normocephalic and atraumatic.  Eyes:     Conjunctiva/sclera: Conjunctivae normal.  Cardiovascular:     Rate and Rhythm: Normal rate and regular rhythm.     Pulses: Normal pulses.     Heart sounds: Normal heart sounds. No murmur.  Pulmonary:     Effort: Pulmonary effort is normal. No respiratory distress.     Breath sounds: Normal breath sounds. No wheezing, rhonchi or rales.  Abdominal:     General: Bowel sounds are normal.     Palpations: Abdomen is soft.     Tenderness: There is no abdominal tenderness.  Musculoskeletal:     Cervical back: Neck supple.     Comments: Left great toe is discolored and is blue/purple.  There is  delayed cap refill.  It is tender to palpation.  Unable to palpate DP pulse but was able to Doppler it.  Skin:    General: Skin is warm and dry.  Neurological:     Mental Status: He is alert.     ED Results / Procedures / Treatments   Labs (all labs ordered are listed, but only abnormal results are displayed) Labs Reviewed  COMPREHENSIVE METABOLIC PANEL - Abnormal; Notable for the following components:      Result Value   Glucose, Bld 107 (*)    All other components within normal limits  CBC - Abnormal; Notable for the following components:   WBC 11.0 (*)    All other components within normal limits  SARS CORONAVIRUS 2 (TAT 6-24 HRS)  URINALYSIS, ROUTINE W REFLEX MICROSCOPIC  APTT  PROTIME-INR  HIV ANTIBODY (ROUTINE TESTING W REFLEX)  HEMOGLOBIN A1C    EKG None  Radiology No results found.  Procedures Procedures (including critical care time)  CRITICAL CARE Performed by: Rodney Booze   Total critical care time: 33 minutes  Critical care time was exclusive of separately billable procedures and treating other patients.  Critical care was necessary to treat or prevent imminent or life-threatening deterioration.  Critical care was time spent personally by me on the following activities: development of treatment plan with patient and/or surrogate as well as nursing, discussions with consultants, evaluation of patient's response to treatment, examination of patient, obtaining history from patient or surrogate, ordering and performing treatments and interventions, ordering and review of laboratory studies, ordering and review of radiographic studies, pulse oximetry and re-evaluation of patient's condition.   Medications Ordered in ED Medications  sodium chloride flush (NS) 0.9 % injection 3 mL (3 mLs Intravenous Not Given 11/03/19 1835)  aspirin EC tablet 81 mg (has no administration in time range)  lisinopril (ZESTRIL) tablet 2.5 mg (has no administration in time  range)  tamsulosin (FLOMAX) capsule 0.4 mg (has no administration in time range)  sodium chloride flush (NS) 0.9 % injection 3 mL (has no administration in time range)  sodium chloride flush (NS) 0.9 % injection 3 mL (has no administration in time range)  0.9 %  sodium chloride infusion (has no administration in time range)  acetaminophen (TYLENOL) tablet 650 mg (has no administration in time range)    Or  acetaminophen (TYLENOL) suppository 650 mg (has no administration in time range)  ketorolac (TORADOL) 15 MG/ML injection 15 mg (has no administration in time range)  insulin aspart (novoLOG) injection 0-15 Units (has no administration in time range)  iohexol (OMNIPAQUE) 350 MG/ML injection 100 mL (100 mLs Intravenous Contrast Given 11/03/19 2036)    ED Course  I have reviewed the triage vital signs and the nursing notes.  Pertinent labs & imaging results that were available during my care of the patient were reviewed by me and considered in my medical decision making (see chart for details).    MDM Rules/Calculators/A&P                      64 year old male with history of peripheral vascular disease presenting for evaluation of pain and discoloration to the left great toe ongoing for the last 2 to 3 days.  On exam pt has a dopplerable DP pulse but the left toe is discolored and has delayed cap refill. Concern for ischemic toe.  Reviewed labs ordered in triage CBC with my leukocytosis at 11,000 CMP with normal kidney and liver function Coags are normal UA negative  7:15 PM CONSULT with Dr. Donzetta Matters with vascular surgery who will see the patient in the ED.  7:44 PM Discussed case with Dr. Donzetta Matters. He recommends ordering CT chest/abd/pelvis with runoff. Recommends consultation with Dr. Earleen Newport as patient has been treated by him in the past.   8:20 PM CONSULT with Dr. Earleen Newport with IR. He recommends starting the patient on heparin and admitting to the hospitalist service.   Heparin ordered     8: 92 PM CONSULT with Dr. Linda Hedges with hospitalist service who accepts patient for admission.   Final Clinical Impression(s) / ED Diagnoses Final diagnoses:  Embolic disease of toe [facility])    Rx / DC Orders ED Discharge Orders    None       Tivis Ringer, Louisa Favaro S, PA-C 11/03/19 2110    Rodney Booze, PA-C 11/03/19 2112    Malvin Johns, MD 11/04/19 1112

## 2019-11-03 NOTE — H&P (Signed)
History and Physical    William Shields UG:[id number] DOB: [birth year] DOA: 11/03/2019  PCP: Jeanie Sewer, NP (Confirm with patient/family/NH records and if not entered, this has to be entered at [facility] point of entry) Patient coming from: [place]  I have personally briefly reviewed patient's old medical records in [place]  Chief Complaint: painful blue left great toe  HPI: William Shields is a 64 y.o. male with medical history significant of of right lower extremity claudication with risk factors hypertension, diabetes and current everyday smoking.  Previously underwent balloon angioplasty of a high-grade stenosis left external iliac artery, balloon angioplasty of a critical stenosis of right external iliac artery and treatment of right SFA disease with directional atherectomy and dcb pta.  There was thought to be a component of thromboembolic disease at that time.  He does take aspirin but apparently has discontinued statin therapy.  States that for the last 1 to 2 days he is now had discoloration of the left great toe.  Denies trauma.  This is significantly painful to him.  His sister who has recently undergone embolectomy for acute limb ischemia saw him and recommended him come to the emergency department.  Of note all of patient's previous procedures were performed by Dr. Earleen Newport with interventional radiology. (For level 3, the HPI must include 4+ descriptors: Location, Quality, Severity, Duration, Timing, Context, modifying factors, associated signs/symptoms and/or status of 3+ chronic problems.)  (Please avoid self-populating past medical history here) (The initial 2-3 lines should be focused and good to copy and paste in the HPI section of the daily progress note).  ED Course: Hemodynamically stable. Vascular surgerry consult obtained. CTA chest and peripheral ordered and done. IR consult obtained. Pharmacy consult for heparinization requested. Per recommendation Vasc Surg - [facility] requested  to admit patient for Heparinization and to complete evaluation for blue toe syndrome  Review of Systems: As per HPI otherwise 10 point review of systems negative.  Unacceptable ROS statements: "10 systems reviewed," "Extensive" (without elaboration).  Acceptable ROS statements: "All others negative," "All others reviewed and are negative," and "All others unremarkable," with at [place] documented Can't double dip - if using for HPI can't use for ROS  Past Medical History:  Diagnosis Date  . Diabetes mellitus without complication ([place])   . Hypertension   . PAD (peripheral artery disease) ([place])     Past Surgical History:  Procedure Laterality Date  . IR RADIOLOGIST EVAL & MGMT  03/02/2018   Soc Hx - married. I son - 71 y/o. Work - long Associate Professor.   reports that he has been smoking cigarettes. He has a 40.00 pack-year smoking history. He has never used smokeless tobacco. He reports current alcohol use. He reports that he does not use drugs.  Allergies  Allergen Reactions  . Pollen Extract Other (See Comments)    Nasal allergies    Family History  Problem Relation Age of Onset  . Coronary artery disease Father   . Microcephaly Father   . Peripheral Artery Disease Sister   . Hyperlipidemia Paternal Grandfather   . CAD Paternal Grandfather    Unacceptable: Noncontributory, unremarkable, or negative. Acceptable: Family history reviewed and not pertinent (If you reviewed it)  Prior to Admission medications   Medication Sig Start Date End Date Taking? Authorizing Provider  aspirin 81 MG tablet Take 81 mg by mouth daily.    [provider]  lisinopril (PRINIVIL,ZESTRIL) 2.5 MG tablet Take 2.5 mg by mouth daily. 02/03/18  [provider]  metFORMIN (GLUCOPHAGE-XR) 500 MG 24 hr tablet Take 500 mg by mouth 2 (two) times daily. 01/28/18   [provider]  tamsulosin (FLOMAX) 0.4 MG CAPS capsule  01/19/18   [provider]    Physical  Exam: Vitals:   11/03/19 1756 11/03/19 1805  BP: (!) 159/73   Pulse: 74   Resp: 20   Temp: 98.7 F (37.1 C)   TempSrc: Oral   SpO2: 99%   Weight:  75.3 kg  Height:  5\' 10"  (1.778 m)    Constitutional: NAD, calm, comfortable Vitals:   11/03/19 1756 11/03/19 1805  BP: (!) 159/73   Pulse: 74   Resp: 20   Temp: 98.7 F (37.1 C)   TempSrc: Oral   SpO2: 99%   Weight:  75.3 kg  Height:  5\' 10"  (1.778 m)   General:  WNWD man in no distress Eyes: PERRL, lids and conjunctivae normal ENMT: Mucous membranes are moist. Posterior pharynx clear of any exudate or lesions.  Neck: normal, supple, no masses, no thyromegaly Respiratory: clear to auscultation bilaterally, no wheezing, no crackles. Normal respiratory effort. No accessory muscle use.  Cardiovascular: Regular rate and rhythm, no murmurs / rubs / gallops. No extremity edema. LE pulses Right - 1+ femoral, 1+ popliteal, trace DP, good capillary refill right toes. Left - 1+ femoral, trace popliteal, non-palpable DP. Left great toes with slow capillary refill, 2nd toe distally with slow capillary refill. Toe is not cold. Abdomen: no tenderness, no masses palpated. No hepatosplenomegaly. Bowel sounds positive.  Musculoskeletal: no clubbing / cyanosis. No joint deformity upper and lower extremities. Good ROM, no contractures. Normal muscle tone.  Skin: no rashes, lesions, ulcers. No induration Neurologic: CN 2-12 grossly intact. Sensation intact, DTR normal. Strength 5/5 in all 4.  Psychiatric: Normal judgment and insight. Alert and oriented x 3. Normal mood.   (Anything < 9 systems with 2 bullets each down codes to level 1) (If patient refuses exam can't bill higher level) (Make sure to document decubitus ulcers present on admission -- if possible -- and whether patient has chronic indwelling catheter at time of admission)  Labs on Admission: I have personally reviewed following labs and imaging studies  CBC: Recent Labs  Lab  11/03/19 1804  WBC 11.0*  HGB 14.6  HCT 43.8  MCV 95.6  PLT 123456   Basic Metabolic Panel: Recent Labs  Lab 11/03/19 1804  NA 141  K 4.7  CL 109  CO2 24  GLUCOSE 107*  BUN 15  CREATININE 1.21  CALCIUM 9.9   GFR: Estimated Creatinine Clearance: 65.4 mL/min (by C-G formula based on SCr of 1.21 mg/dL). Liver Function Tests: Recent Labs  Lab 11/03/19 1804  AST 20  ALT 27  ALKPHOS 67  BILITOT 0.8  PROT 6.7  ALBUMIN 4.2   No results for input(s): LIPASE, AMYLASE in the last 168 hours. No results for input(s): AMMONIA in the last 168 hours. Coagulation Profile: Recent Labs  Lab 11/03/19 1804  INR 0.9   Cardiac Enzymes: No results for input(s): CKTOTAL, CKMB, CKMBINDEX, TROPONINI in the last 168 hours. BNP (last 3 results) No results for input(s): PROBNP in the last 8760 hours. HbA1C: No results for input(s): HGBA1C in the last 72 hours. CBG: No results for input(s): GLUCAP in the last 168 hours. Lipid Profile: No results for input(s): CHOL, HDL, LDLCALC, TRIG, CHOLHDL, LDLDIRECT in the last 72 hours. Thyroid Function Tests: No results for input(s): TSH, T4TOTAL, FREET4, T3FREE, THYROIDAB  in the last 72 hours. Anemia Panel: No results for input(s): VITAMINB12, FOLATE, FERRITIN, TIBC, IRON, RETICCTPCT in the last 72 hours. Urine analysis:    Component Value Date/Time   COLORURINE YELLOW 11/03/2019 1855   APPEARANCEUR CLEAR 11/03/2019 1855   LABSPEC 1.012 11/03/2019 1855   PHURINE 6.0 11/03/2019 1855   GLUCOSEU NEGATIVE 11/03/2019 1855   HGBUR NEGATIVE 11/03/2019 1855   BILIRUBINUR NEGATIVE 11/03/2019 1855   KETONESUR NEGATIVE 11/03/2019 1855   PROTEINUR NEGATIVE 11/03/2019 1855   NITRITE NEGATIVE 11/03/2019 1855   LEUKOCYTESUR NEGATIVE 11/03/2019 1855    Radiological Exams on Admission: CT ANGIO AO+BIFEM W & OR WO CONTRAST  Result Date: 11/03/2019 CLINICAL DATA:  Great toe discoloration. EXAM: CT ANGIOGRAPHY OF ABDOMINAL AORTA WITH ILIOFEMORAL RUNOFF CT  ANGIO CHEST TECHNIQUE: Multidetector CT imaging of the abdomen, pelvis and lower extremities was performed using the standard protocol during bolus administration of intravenous contrast. Multiplanar CT image reconstructions and MIPs were obtained to evaluate the vascular anatomy. Multidetector CT imaging of the chest, abdomen, pelvis and lower extremities was performed using the standard protocol during bolus administration of intravenous contrast. Multiplanar CT image reconstructions and MIPs were obtained to evaluate the vascular anatomy. CONTRAST:  151mL OMNIPAQUE IOHEXOL 350 MG/ML SOLN COMPARISON:  None. CT angio dated March 06, 2018. FINDINGS: VASCULAR Aorta: There are atherosclerotic changes throughout the abdominal aorta without evidence for an aneurysm or dissection. Celiac: There is moderate narrowing at the origin of the celiac axis which may be secondary to atherosclerotic disease or median arcuate ligament syndrome there is some mild poststenotic dilatation. SMA: Patent without evidence of aneurysm, dissection, vasculitis or significant stenosis. Renals: There are 2 patent right renal arteries. There are 2 patent left renal arteries. IMA: Patent without evidence of aneurysm, dissection, vasculitis or significant stenosis. RIGHT Lower Extremity Inflow: There are atherosclerotic changes of the common iliac artery without evidence for high-grade stenosis. There is a less than 50% stenosis at the origin of the right external iliac artery. There is improved luminal diameter of the distal right external iliac artery when compared to prior study in 2019 consistent with a positive response to treatment. There may be an approximately 50% stenosis at this level. Outflow: There is mild narrowing of the right common femoral artery. There is mild atherosclerotic disease throughout the right profundus femoris and right superficial femoral arteries without evidence for a high-grade stenosis. The right popliteal artery  is patent without evidence for high-grade stenosis. Runoff: The proximal anterior tibial, posterior tibial, and peroneal arteries are patent. There is suboptimal opacification of the right anterior tibial and peroneal arteries at the level of the ankle. The posterior tibial artery appears to be patent. This appearance is similar to study in 2019. LEFT Lower Extremity Inflow: Atherosclerotic disease is noted involving the left common iliac artery. There is an approximately 70% stenosis in the mid to distal left common iliac artery (axial series 1, image 246). The left internal iliac artery is occluded. There is mild-to-moderate stenosis of the proximal left external iliac artery. There is a high-grade stenosis of the left external iliac artery (axial series 1, image 270). This is relatively similar to prior CT in 2019. The remaining portions of the left external iliac artery are widely patent. Outflow: There is mild narrowing of the distal left common femoral artery (axial series 1, image 311). The left profunda femoris and left SFA are patent with mild atherosclerotic disease throughout. The left popliteal artery is widely patent. Runoff: The left anterior tibial,  posterior tibial, and peroneal arteries are patent proximally. There is suboptimal opacification of the left anterior tibial and peroneal arteries at the level of the ankle. The posterior tibial artery appears to be patent. There is a patent lateral and medial plantar arch. Veins: No obvious venous abnormality within the limitations of this arterial phase study. Review of the MIP images confirms the above findings. NON-VASCULAR CT CHEST: Cardiovascular: There is no evidence for thoracic aortic aneurysm or dissection. Atherosclerotic changes are noted of the thoracic aorta. There is no evidence for an acute pulmonary embolism. The heart size is normal. The arch vessels are patent with some mild to moderate narrowing at the origin of the left common carotid  artery. There are atherosclerotic changes of both common carotid arteries without evidence for high-grade stenosis. The right vertebral artery is dominant. Mediastinum/Nodes: --No mediastinal or hilar lymphadenopathy. --No axillary lymphadenopathy. --No supraclavicular lymphadenopathy. --Normal thyroid gland. --The esophagus is unremarkable Lungs/Pleura: Emphysematous changes are noted bilaterally. There is a pulmonary nodule in the posterior right upper lobe measuring approximately 6 mm (axial series 1, image 38). There is no pneumothorax or large pleural effusion. Musculoskeletal: No chest wall abnormality. No acute or significant osseous findings. Review of the MIP images confirms the above findings. CT ABDOMEN PELVIS: Hepatobiliary: There is a cystic appearing lesion in the posterior right hepatic lobe, stable from prior study. Normal gallbladder.There is no biliary ductal dilation. Pancreas: Normal contours without ductal dilatation. No peripancreatic fluid collection. Spleen: No splenic laceration or hematoma. Adrenals/Urinary Tract: --Adrenal glands: No adrenal hemorrhage. --Right kidney/ureter: No hydronephrosis or perinephric hematoma. --Left kidney/ureter: No hydronephrosis or perinephric hematoma. --Urinary bladder: Unremarkable. Stomach/Bowel: --Stomach/Duodenum: There is a large duodenal diverticulum. --Small bowel: No dilatation or inflammation. --Colon: Rectosigmoid diverticulosis without acute inflammation. --Appendix: Normal. Lymphatic: --No retroperitoneal lymphadenopathy. --No mesenteric lymphadenopathy. --No pelvic or inguinal lymphadenopathy. Reproductive: Unremarkable Other: No ascites or free air. There are bilateral fat containing inguinal hernias, right greater than left. Musculoskeletal. No acute displaced fractures. IMPRESSION: 1. No evidence for an acute vascular abnormality. No evidence for dissection. No acute pulmonary embolism. No aortic aneurysm. No evidence for an acute arterial  embolism. 2. There is a mild stenosis of the distal right external iliac artery, significantly improved from prior study in 2019. Atherosclerotic disease is noted throughout the right superficial femoral artery resulting in mild-to-moderate narrowing and no evidence for a high-grade stenosis. There is a similar single vessel runoff via the posterior tibial artery on the right. There is questionable opacification of the peroneal and anterior tibial arteries at the level of the ankle. 3. Atherosclerotic changes are noted of the left common iliac artery resulting in approximately 70% stenosis. There is persistent moderate narrowing of the proximal left external iliac artery, slightly improved since 2019. Mild-to-moderate narrowing is noted of the left distal common femoral artery. Again noted is an essentially single vessel runoff of the left lower extremity via the posterior tibial artery. There is minimal opacification of the posterior tibial and peroneal arteries at the level of the ankle. 4. There is a 6 mm pulmonary nodule in the right upper lobe. Non-contrast chest CT at 3-6 months is recommended. If the nodules are stable at time of repeat CT, then future CT at 18-24 months (from today's scan) is considered optional for low-risk patients, but is recommended for high-risk patients. This recommendation follows the consensus statement: Guidelines for Management of Incidental Pulmonary Nodules Detected on CT Images: From the Fleischner Society 2017; Radiology 2017; 284:228-243. 5. Moderate narrowing of  the origin of the celiac axis, similar to prior study. 6. There is diverticulosis without CT evidence for diverticulitis. 7. Aortic Atherosclerosis (ICD10-I70.0) and Emphysema (ICD10-J43.9). Electronically Signed   By: Constance Holster M.D.   On: 11/03/2019 21:35   CT ANGIO CHEST AORTA W/CM &/OR WO/CM  Result Date: 11/03/2019 CLINICAL DATA:  Great toe discoloration. EXAM: CT ANGIOGRAPHY OF ABDOMINAL AORTA WITH  ILIOFEMORAL RUNOFF CT ANGIO CHEST TECHNIQUE: Multidetector CT imaging of the abdomen, pelvis and lower extremities was performed using the standard protocol during bolus administration of intravenous contrast. Multiplanar CT image reconstructions and MIPs were obtained to evaluate the vascular anatomy. Multidetector CT imaging of the chest, abdomen, pelvis and lower extremities was performed using the standard protocol during bolus administration of intravenous contrast. Multiplanar CT image reconstructions and MIPs were obtained to evaluate the vascular anatomy. CONTRAST:  174mL OMNIPAQUE IOHEXOL 350 MG/ML SOLN COMPARISON:  None. CT angio dated March 06, 2018. FINDINGS: VASCULAR Aorta: There are atherosclerotic changes throughout the abdominal aorta without evidence for an aneurysm or dissection. Celiac: There is moderate narrowing at the origin of the celiac axis which may be secondary to atherosclerotic disease or median arcuate ligament syndrome there is some mild poststenotic dilatation. SMA: Patent without evidence of aneurysm, dissection, vasculitis or significant stenosis. Renals: There are 2 patent right renal arteries. There are 2 patent left renal arteries. IMA: Patent without evidence of aneurysm, dissection, vasculitis or significant stenosis. RIGHT Lower Extremity Inflow: There are atherosclerotic changes of the common iliac artery without evidence for high-grade stenosis. There is a less than 50% stenosis at the origin of the right external iliac artery. There is improved luminal diameter of the distal right external iliac artery when compared to prior study in 2019 consistent with a positive response to treatment. There may be an approximately 50% stenosis at this level. Outflow: There is mild narrowing of the right common femoral artery. There is mild atherosclerotic disease throughout the right profundus femoris and right superficial femoral arteries without evidence for a high-grade stenosis. The  right popliteal artery is patent without evidence for high-grade stenosis. Runoff: The proximal anterior tibial, posterior tibial, and peroneal arteries are patent. There is suboptimal opacification of the right anterior tibial and peroneal arteries at the level of the ankle. The posterior tibial artery appears to be patent. This appearance is similar to study in 2019. LEFT Lower Extremity Inflow: Atherosclerotic disease is noted involving the left common iliac artery. There is an approximately 70% stenosis in the mid to distal left common iliac artery (axial series 1, image 246). The left internal iliac artery is occluded. There is mild-to-moderate stenosis of the proximal left external iliac artery. There is a high-grade stenosis of the left external iliac artery (axial series 1, image 270). This is relatively similar to prior CT in 2019. The remaining portions of the left external iliac artery are widely patent. Outflow: There is mild narrowing of the distal left common femoral artery (axial series 1, image 311). The left profunda femoris and left SFA are patent with mild atherosclerotic disease throughout. The left popliteal artery is widely patent. Runoff: The left anterior tibial, posterior tibial, and peroneal arteries are patent proximally. There is suboptimal opacification of the left anterior tibial and peroneal arteries at the level of the ankle. The posterior tibial artery appears to be patent. There is a patent lateral and medial plantar arch. Veins: No obvious venous abnormality within the limitations of this arterial phase study. Review of the MIP images confirms  the above findings. NON-VASCULAR CT CHEST: Cardiovascular: There is no evidence for thoracic aortic aneurysm or dissection. Atherosclerotic changes are noted of the thoracic aorta. There is no evidence for an acute pulmonary embolism. The heart size is normal. The arch vessels are patent with some mild to moderate narrowing at the origin of  the left common carotid artery. There are atherosclerotic changes of both common carotid arteries without evidence for high-grade stenosis. The right vertebral artery is dominant. Mediastinum/Nodes: --No mediastinal or hilar lymphadenopathy. --No axillary lymphadenopathy. --No supraclavicular lymphadenopathy. --Normal thyroid gland. --The esophagus is unremarkable Lungs/Pleura: Emphysematous changes are noted bilaterally. There is a pulmonary nodule in the posterior right upper lobe measuring approximately 6 mm (axial series 1, image 38). There is no pneumothorax or large pleural effusion. Musculoskeletal: No chest wall abnormality. No acute or significant osseous findings. Review of the MIP images confirms the above findings. CT ABDOMEN PELVIS: Hepatobiliary: There is a cystic appearing lesion in the posterior right hepatic lobe, stable from prior study. Normal gallbladder.There is no biliary ductal dilation. Pancreas: Normal contours without ductal dilatation. No peripancreatic fluid collection. Spleen: No splenic laceration or hematoma. Adrenals/Urinary Tract: --Adrenal glands: No adrenal hemorrhage. --Right kidney/ureter: No hydronephrosis or perinephric hematoma. --Left kidney/ureter: No hydronephrosis or perinephric hematoma. --Urinary bladder: Unremarkable. Stomach/Bowel: --Stomach/Duodenum: There is a large duodenal diverticulum. --Small bowel: No dilatation or inflammation. --Colon: Rectosigmoid diverticulosis without acute inflammation. --Appendix: Normal. Lymphatic: --No retroperitoneal lymphadenopathy. --No mesenteric lymphadenopathy. --No pelvic or inguinal lymphadenopathy. Reproductive: Unremarkable Other: No ascites or free air. There are bilateral fat containing inguinal hernias, right greater than left. Musculoskeletal. No acute displaced fractures. IMPRESSION: 1. No evidence for an acute vascular abnormality. No evidence for dissection. No acute pulmonary embolism. No aortic aneurysm. No evidence  for an acute arterial embolism. 2. There is a mild stenosis of the distal right external iliac artery, significantly improved from prior study in 2019. Atherosclerotic disease is noted throughout the right superficial femoral artery resulting in mild-to-moderate narrowing and no evidence for a high-grade stenosis. There is a similar single vessel runoff via the posterior tibial artery on the right. There is questionable opacification of the peroneal and anterior tibial arteries at the level of the ankle. 3. Atherosclerotic changes are noted of the left common iliac artery resulting in approximately 70% stenosis. There is persistent moderate narrowing of the proximal left external iliac artery, slightly improved since 2019. Mild-to-moderate narrowing is noted of the left distal common femoral artery. Again noted is an essentially single vessel runoff of the left lower extremity via the posterior tibial artery. There is minimal opacification of the posterior tibial and peroneal arteries at the level of the ankle. 4. There is a 6 mm pulmonary nodule in the right upper lobe. Non-contrast chest CT at 3-6 months is recommended. If the nodules are stable at time of repeat CT, then future CT at 18-24 months (from today's scan) is considered optional for low-risk patients, but is recommended for high-risk patients. This recommendation follows the consensus statement: Guidelines for Management of Incidental Pulmonary Nodules Detected on CT Images: From the Fleischner Society 2017; Radiology 2017; 284:228-243. 5. 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 6. There is diverticulosis without CT evidence for diverticulitis. 7. Aortic Atherosclerosis (ICD10-I70.0) and Emphysema (ICD10-J43.9). Electronically Signed   By: Constance Holster M.D.   On: 11/03/2019 21:35    EKG: Independently reviewed. No EKG done in ED  Assessment/Plan Active Problems:   Blue toe syndrome of left  lower  extremity ([place])   PAD (peripheral artery disease) ([facility])   Tobacco abuse disorder   Essential hypertension   Secondary DM without complications ([place])  (please populate well all problems here in Problem List. (For example, if patient is on BP meds at home and you resume or decide to hold them, it is a problem that needs to be her. Same for CAD, COPD, HLD and so on)   1. Blue toe syndrome left - pt with known PAD s/p several IR vascular procedures presents with discolored, cool and painful left great toe. Exam confirms poor circulation. CTA reveals PAD Plan Med-surg observation  Heparinzation per pharmacy  IR consultation.  Recommend changing from ARB to CCB for better effect on circulation small vessels  Smoking cessation  2. HTN- adequate control Plan Recommend changing from ARB to CCB at time of discharge for better effect small   vessel dilitation/circulation  3. DM - on metformin Plan A1C ordered  Hold metformin  Ss scale while in-patiet  4. Tobacco abuse - discussed triple threat of diabetes, familial HLD/PVD and nicotine. Plan Nicotine patch while in-patient  Counseled patient on cessation strategies, behavioral change, use of supplements   And aids, I.e. patches    DVT prophylaxis:  Full dose heparin -pharmacy consult.  (Lovenox/Heparin/SCD's/anticoagulated/None (if comfort care) Code Status: full code (Full/Partial (specify details) Family Communication: spoke with wife (Specify name, relationship. Do not write "discussed with patient". Specify tel # if discussed over the phone) Disposition Plan: home in 24-48 hrs (specify when and where you expect patient to be discharged) Consults called: Dr. Earleen Newport - IR (with names) Admission status: observation (inpatient / obs / tele / medical floor / SDU)   Adella Hare MD [facility] Pager [phone]  If 7PM-7AM, please contact night-coverage [email or web address] Password [facility]  11/03/2019, 9:41 PM

## 2019-11-03 NOTE — Consult Note (Signed)
Chief Complaint: Blue toe  Referring Physician(s): Tivis Ringer, [place]  Supervising Physician: Corrie Mckusick  Patient Status: [facility] - ED  History of Present Illness: William Shields is a 64 y.o. male presenting to [facility] ED with discolored and painful left great toe.   William Shields tells me that his left great toe turned dark blue a few days ago, he thinks Wednesday of this week, with associated pain & tenderness.  He thinks that there has been also some discoloration of the left second toe.  Since it happened, his pain symptoms have become slightly better, however the discoloration persists.    He tells me that preceding this, he had no resting pain at night.    He does endorse short distance claudication, saying at this time he is only able to walk about 300 feet before he has left leg pain that he needs to stop.  He denies any right leg symptoms.   We know him from prior treatment, having met June 2019, with right greater than left lower extremity symptoms.  We previously treated him with left CFA access, left EIA PTA, (59m), right EIA PTA (668m, and DARRT of right SFA (26m3m  After this treatment, his symptoms resolved.  Right ABI: 0.67 --> 0.85, Left ABI: 0.89 --> 0.93  He denies any recent hospitalizations, recent fever/rigors/chills.  He denies any chest pain with exercise or rest.  His PCP is SteJeanie SewerP, and podiatrist is Dr. MicHardie Pulley He does endorse continuing to smoke about 1ppd.    Dr. C[facility]Donzetta [facility] has seen William Shields as well, and discussed the CTA order with our ED team.  This has been completed at this time, which I have reviewed.   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    Past Medical History:  Diagnosis Date  . Diabetes mellitus without complication ([place] . Hypertension   . PAD (peripheral artery disease) ([place]   Past Surgical History:  Procedure  Laterality Date  . IR RADIOLOGIST EVAL & MGMT  03/02/2018    Allergies: Pollen extract  Medications: Prior to Admission medications   Medication Sig Start Date End Date Taking? Authorizing Provider  aspirin 325 MG tablet Take 162.5 mg by mouth daily.   Yes [provider]  glipiZIDE (GLUCOTROL XL) 5 MG 24 hr tablet Take 5 mg by mouth daily with breakfast.   Yes [provider]  losartan (COZAAR) 25 MG tablet Take 25 mg by mouth daily. 10/16/19  Yes [provider]  pravastatin (PRAVACHOL) 20 MG tablet Take 20 mg by mouth daily. 10/16/19  Yes [provider]  meclizine (ANTIVERT) 25 MG tablet Take 25 mg by mouth 3 (three) times daily as needed. 07/22/19   [provider]  metFORMIN (GLUCOPHAGE-XR) 500 MG 24 hr tablet Take 500 mg by mouth 2 (two) times daily. 01/28/18   [provider]  tamsulosin (FLOMAX) 0.4 MG CAPS capsule  01/19/18   [provider]     Family History  Problem Relation Age of Onset  . Coronary artery disease Father   . Microcephaly Father   . Peripheral Artery Disease Sister   . Hyperlipidemia Paternal Grandfather   . CAD Paternal Grandfather     Social History   Socioeconomic History  . Marital status: Married    Spouse name: Not on file  . Number  of children: Not on file  . Years of education: Not on file  . Highest education level: Not on file  Occupational History  . Not on file  Tobacco Use  . Smoking status: Current Every Day Smoker    Packs/day: 1.00    Years: 40.00    Pack years: 40.00    Types: Cigarettes  . Smokeless tobacco: Never Used  Substance and Sexual Activity  . Alcohol use: Yes    Comment: occs.  . Drug use: Never  . Sexual activity: Not on file  Other Topics Concern  . Not on file  Social History Narrative  . Not on file   Social Determinants of Health   Financial Resource Strain:   . Difficulty of Paying Living Expenses: Not on file  Food Insecurity:   . Worried  About Charity fundraiser in the Last Year: Not on file  . Ran Out of Food in the Last Year: Not on file  Transportation Needs:   . Lack of Transportation (Medical): Not on file  . Lack of Transportation (Non-Medical): Not on file  Physical Activity:   . Days of Exercise per Week: Not on file  . Minutes of Exercise per Session: Not on file  Stress:   . Feeling of Stress : Not on file  Social Connections:   . Frequency of Communication with Friends and Family: Not on file  . Frequency of Social Gatherings with Friends and Family: Not on file  . Attends Religious Services: Not on file  . Active Member of Clubs or Organizations: Not on file  . Attends Archivist Meetings: Not on file  . Marital Status: Not on file     Review of Systems: A 12 point ROS discussed and pertinent positives are indicated in the HPI above.  All other systems are negative.  Review of Systems  Vital Signs: BP (!) 159/73 (BP Location: Right Arm)   Pulse 74   Temp 98.7 F (37.1 C) (Oral)   Resp 20   Ht '5\' 10"'  (1.778 m)   Wt 75.3 kg   SpO2 99%   BMI 23.82 kg/m   Physical Exam General: 64 yo male appearing stated age.  Well-developed, well-nourished.  No distress. HEENT: Atraumatic, normocephalic.  Conjugate gaze, extra-ocular motor intact. No scleral icterus or scleral injection. No lesions on external ears, nose, lips, or gums.  Oral mucosa moist, pink.  Neck: Symmetric with no goiter enlargement.  Chest/Lungs:  Symmetric chest with inspiration/expiration.  No labored breathing.    Heart:   No JVD appreciated.  Abdomen:  Soft, NT/ND, with + bowel sounds.   Genito-urinary: Deferred Neurologic: Alert & Oriented to person, place, and time.   Normal affect and insight.  Appropriate questions.  Moving all 4 extremities with gross sensory intact.  Pulse Exam:  Bilateral CFA pulse palpable, R>L.  Palpable PT bilateral.  Doppler + AT bilateral.  Extremities: No wound.  Purplish discoloration of the  left great toe extending just proximal to the IP.  The distal second left toe has some discoloration.  Motor and sensory intact bilateral.   Imaging: CT ANGIO AO+BIFEM W & OR WO CONTRAST  Result Date: 11/03/2019 CLINICAL DATA:  Great toe discoloration. EXAM: CT ANGIOGRAPHY OF ABDOMINAL AORTA WITH ILIOFEMORAL RUNOFF CT ANGIO CHEST TECHNIQUE: Multidetector CT imaging of the abdomen, pelvis and lower extremities was performed using the standard protocol during bolus administration of intravenous contrast. Multiplanar CT image reconstructions and MIPs were obtained to evaluate the  vascular anatomy. Multidetector CT imaging of the chest, abdomen, pelvis and lower extremities was performed using the standard protocol during bolus administration of intravenous contrast. Multiplanar CT image reconstructions and MIPs were obtained to evaluate the vascular anatomy. CONTRAST:  130m OMNIPAQUE IOHEXOL 350 MG/ML SOLN COMPARISON:  None. CT angio dated March 06, 2018. FINDINGS: VASCULAR Aorta: There are atherosclerotic changes throughout the abdominal aorta without evidence for an aneurysm or dissection. Celiac: There is moderate narrowing at the origin of the celiac axis which may be secondary to atherosclerotic disease or median arcuate ligament syndrome there is some mild poststenotic dilatation. SMA: Patent without evidence of aneurysm, dissection, vasculitis or significant stenosis. Renals: There are 2 patent right renal arteries. There are 2 patent left renal arteries. IMA: Patent without evidence of aneurysm, dissection, vasculitis or significant stenosis. RIGHT Lower Extremity Inflow: There are atherosclerotic changes of the common iliac artery without evidence for high-grade stenosis. There is a less than 50% stenosis at the origin of the right external iliac artery. There is improved luminal diameter of the distal right external iliac artery when compared to prior study in 2019 consistent with a positive response to  treatment. There may be an approximately 50% stenosis at this level. Outflow: There is mild narrowing of the right common femoral artery. There is mild atherosclerotic disease throughout the right profundus femoris and right superficial femoral arteries without evidence for a high-grade stenosis. The right popliteal artery is patent without evidence for high-grade stenosis. Runoff: The proximal anterior tibial, posterior tibial, and peroneal arteries are patent. There is suboptimal opacification of the right anterior tibial and peroneal arteries at the level of the ankle. The posterior tibial artery appears to be patent. This appearance is similar to study in 2019. LEFT Lower Extremity Inflow: Atherosclerotic disease is noted involving the left common iliac artery. There is an approximately 70% stenosis in the mid to distal left common iliac artery (axial series 1, image 246). The left internal iliac artery is occluded. There is mild-to-moderate stenosis of the proximal left external iliac artery. There is a high-grade stenosis of the left external iliac artery (axial series 1, image 270). This is relatively similar to prior CT in 2019. The remaining portions of the left external iliac artery are widely patent. Outflow: There is mild narrowing of the distal left common femoral artery (axial series 1, image 311). The left profunda femoris and left SFA are patent with mild atherosclerotic disease throughout. The left popliteal artery is widely patent. Runoff: The left anterior tibial, posterior tibial, and peroneal arteries are patent proximally. There is suboptimal opacification of the left anterior tibial and peroneal arteries at the level of the ankle. The posterior tibial artery appears to be patent. There is a patent lateral and medial plantar arch. Veins: No obvious venous abnormality within the limitations of this arterial phase study. Review of the MIP images confirms the above findings. NON-VASCULAR CT CHEST:  Cardiovascular: There is no evidence for thoracic aortic aneurysm or dissection. Atherosclerotic changes are noted of the thoracic aorta. There is no evidence for an acute pulmonary embolism. The heart size is normal. The arch vessels are patent with some mild to moderate narrowing at the origin of the left common carotid artery. There are atherosclerotic changes of both common carotid arteries without evidence for high-grade stenosis. The right vertebral artery is dominant. Mediastinum/Nodes: --No mediastinal or hilar lymphadenopathy. --No axillary lymphadenopathy. --No supraclavicular lymphadenopathy. --Normal thyroid gland. --The esophagus is unremarkable Lungs/Pleura: Emphysematous changes are noted bilaterally. There  is a pulmonary nodule in the posterior right upper lobe measuring approximately 6 mm (axial series 1, image 38). There is no pneumothorax or large pleural effusion. Musculoskeletal: No chest wall abnormality. No acute or significant osseous findings. Review of the MIP images confirms the above findings. CT ABDOMEN PELVIS: Hepatobiliary: There is a cystic appearing lesion in the posterior right hepatic lobe, stable from prior study. Normal gallbladder.There is no biliary ductal dilation. Pancreas: Normal contours without ductal dilatation. No peripancreatic fluid collection. Spleen: No splenic laceration or hematoma. Adrenals/Urinary Tract: --Adrenal glands: No adrenal hemorrhage. --Right kidney/ureter: No hydronephrosis or perinephric hematoma. --Left kidney/ureter: No hydronephrosis or perinephric hematoma. --Urinary bladder: Unremarkable. Stomach/Bowel: --Stomach/Duodenum: There is a large duodenal diverticulum. --Small bowel: No dilatation or inflammation. --Colon: Rectosigmoid diverticulosis without acute inflammation. --Appendix: Normal. Lymphatic: --No retroperitoneal lymphadenopathy. --No mesenteric lymphadenopathy. --No pelvic or inguinal lymphadenopathy. Reproductive: Unremarkable Other:  No ascites or free air. There are bilateral fat containing inguinal hernias, right greater than left. Musculoskeletal. No acute displaced fractures. IMPRESSION: 1. No evidence for an acute vascular abnormality. No evidence for dissection. No acute pulmonary embolism. No aortic aneurysm. No evidence for an acute arterial embolism. 2. There is a mild stenosis of the distal right external iliac artery, significantly improved from prior study in 2019. Atherosclerotic disease is noted throughout the right superficial femoral artery resulting in mild-to-moderate narrowing and no evidence for a high-grade stenosis. There is a similar single vessel runoff via the posterior tibial artery on the right. There is questionable opacification of the peroneal and anterior tibial arteries at the level of the ankle. 3. Atherosclerotic changes are noted of the left common iliac artery resulting in approximately 70% stenosis. There is persistent moderate narrowing of the proximal left external iliac artery, slightly improved since 2019. Mild-to-moderate narrowing is noted of the left distal common femoral artery. Again noted is an essentially single vessel runoff of the left lower extremity via the posterior tibial artery. There is minimal opacification of the posterior tibial and peroneal arteries at the level of the ankle. 4. There is a 6 mm pulmonary nodule in the right upper lobe. Non-contrast chest CT at 3-6 months is recommended. If the nodules are stable at time of repeat CT, then future CT at 18-24 months (from today's scan) is considered optional for low-risk patients, but is recommended for high-risk patients. This recommendation follows the consensus statement: Guidelines for Management of Incidental Pulmonary Nodules Detected on CT Images: From the Fleischner Society 2017; Radiology 2017; 284:228-243. 5. 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 6. There is diverticulosis without CT  evidence for diverticulitis. 7. Aortic Atherosclerosis (ICD10-I70.0) and Emphysema (ICD10-J43.9). Electronically Signed   By: Constance Holster M.D.   On: 11/03/2019 21:35   CT ANGIO CHEST AORTA W/CM &/OR WO/CM  Result Date: 11/03/2019 CLINICAL DATA:  Great toe discoloration. EXAM: CT ANGIOGRAPHY OF ABDOMINAL AORTA WITH ILIOFEMORAL RUNOFF CT ANGIO CHEST TECHNIQUE: Multidetector CT imaging of the abdomen, pelvis and lower extremities was performed using the standard protocol during bolus administration of intravenous contrast. Multiplanar CT image reconstructions and MIPs were obtained to evaluate the vascular anatomy. Multidetector CT imaging of the chest, abdomen, pelvis and lower extremities was performed using the standard protocol during bolus administration of intravenous contrast. Multiplanar CT image reconstructions and MIPs were obtained to evaluate the vascular anatomy. CONTRAST:  134m OMNIPAQUE IOHEXOL 350 MG/ML SOLN COMPARISON:  None. CT angio dated March 06, 2018. FINDINGS: VASCULAR Aorta: There are atherosclerotic changes  throughout the abdominal aorta without evidence for an aneurysm or dissection. Celiac: There is moderate narrowing at the origin of the celiac axis which may be secondary to atherosclerotic disease or median arcuate ligament syndrome there is some mild poststenotic dilatation. SMA: Patent without evidence of aneurysm, dissection, vasculitis or significant stenosis. Renals: There are 2 patent right renal arteries. There are 2 patent left renal arteries. IMA: Patent without evidence of aneurysm, dissection, vasculitis or significant stenosis. RIGHT Lower Extremity Inflow: There are atherosclerotic changes of the common iliac artery without evidence for high-grade stenosis. There is a less than 50% stenosis at the origin of the right external iliac artery. There is improved luminal diameter of the distal right external iliac artery when compared to prior study in 2019 consistent with  a positive response to treatment. There may be an approximately 50% stenosis at this level. Outflow: There is mild narrowing of the right common femoral artery. There is mild atherosclerotic disease throughout the right profundus femoris and right superficial femoral arteries without evidence for a high-grade stenosis. The right popliteal artery is patent without evidence for high-grade stenosis. Runoff: The proximal anterior tibial, posterior tibial, and peroneal arteries are patent. There is suboptimal opacification of the right anterior tibial and peroneal arteries at the level of the ankle. The posterior tibial artery appears to be patent. This appearance is similar to study in 2019. LEFT Lower Extremity Inflow: Atherosclerotic disease is noted involving the left common iliac artery. There is an approximately 70% stenosis in the mid to distal left common iliac artery (axial series 1, image 246). The left internal iliac artery is occluded. There is mild-to-moderate stenosis of the proximal left external iliac artery. There is a high-grade stenosis of the left external iliac artery (axial series 1, image 270). This is relatively similar to prior CT in 2019. The remaining portions of the left external iliac artery are widely patent. Outflow: There is mild narrowing of the distal left common femoral artery (axial series 1, image 311). The left profunda femoris and left SFA are patent with mild atherosclerotic disease throughout. The left popliteal artery is widely patent. Runoff: The left anterior tibial, posterior tibial, and peroneal arteries are patent proximally. There is suboptimal opacification of the left anterior tibial and peroneal arteries at the level of the ankle. The posterior tibial artery appears to be patent. There is a patent lateral and medial plantar arch. Veins: No obvious venous abnormality within the limitations of this arterial phase study. Review of the MIP images confirms the above findings.  NON-VASCULAR CT CHEST: Cardiovascular: There is no evidence for thoracic aortic aneurysm or dissection. Atherosclerotic changes are noted of the thoracic aorta. There is no evidence for an acute pulmonary embolism. The heart size is normal. The arch vessels are patent with some mild to moderate narrowing at the origin of the left common carotid artery. There are atherosclerotic changes of both common carotid arteries without evidence for high-grade stenosis. The right vertebral artery is dominant. Mediastinum/Nodes: --No mediastinal or hilar lymphadenopathy. --No axillary lymphadenopathy. --No supraclavicular lymphadenopathy. --Normal thyroid gland. --The esophagus is unremarkable Lungs/Pleura: Emphysematous changes are noted bilaterally. There is a pulmonary nodule in the posterior right upper lobe measuring approximately 6 mm (axial series 1, image 38). There is no pneumothorax or large pleural effusion. Musculoskeletal: No chest wall abnormality. No acute or significant osseous findings. Review of the MIP images confirms the above findings. CT ABDOMEN PELVIS: Hepatobiliary: There is a cystic appearing lesion in the posterior right hepatic  lobe, stable from prior study. Normal gallbladder.There is no biliary ductal dilation. Pancreas: Normal contours without ductal dilatation. No peripancreatic fluid collection. Spleen: No splenic laceration or hematoma. Adrenals/Urinary Tract: --Adrenal glands: No adrenal hemorrhage. --Right kidney/ureter: No hydronephrosis or perinephric hematoma. --Left kidney/ureter: No hydronephrosis or perinephric hematoma. --Urinary bladder: Unremarkable. Stomach/Bowel: --Stomach/Duodenum: There is a large duodenal diverticulum. --Small bowel: No dilatation or inflammation. --Colon: Rectosigmoid diverticulosis without acute inflammation. --Appendix: Normal. Lymphatic: --No retroperitoneal lymphadenopathy. --No mesenteric lymphadenopathy. --No pelvic or inguinal lymphadenopathy.  Reproductive: Unremarkable Other: No ascites or free air. There are bilateral fat containing inguinal hernias, right greater than left. Musculoskeletal. No acute displaced fractures. IMPRESSION: 1. No evidence for an acute vascular abnormality. No evidence for dissection. No acute pulmonary embolism. No aortic aneurysm. No evidence for an acute arterial embolism. 2. There is a mild stenosis of the distal right external iliac artery, significantly improved from prior study in 2019. Atherosclerotic disease is noted throughout the right superficial femoral artery resulting in mild-to-moderate narrowing and no evidence for a high-grade stenosis. There is a similar single vessel runoff via the posterior tibial artery on the right. There is questionable opacification of the peroneal and anterior tibial arteries at the level of the ankle. 3. Atherosclerotic changes are noted of the left common iliac artery resulting in approximately 70% stenosis. There is persistent moderate narrowing of the proximal left external iliac artery, slightly improved since 2019. Mild-to-moderate narrowing is noted of the left distal common femoral artery. Again noted is an essentially single vessel runoff of the left lower extremity via the posterior tibial artery. There is minimal opacification of the posterior tibial and peroneal arteries at the level of the ankle. 4. There is a 6 mm pulmonary nodule in the right upper lobe. Non-contrast chest CT at 3-6 months is recommended. If the nodules are stable at time of repeat CT, then future CT at 18-24 months (from today's scan) is considered optional for low-risk patients, but is recommended for high-risk patients. This recommendation follows the consensus statement: Guidelines for Management of Incidental Pulmonary Nodules Detected on CT Images: From the Fleischner Society 2017; Radiology 2017; 284:228-243. 5. 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 6. There  is diverticulosis without CT evidence for diverticulitis. 7. Aortic Atherosclerosis (ICD10-I70.0) and Emphysema (ICD10-J43.9). Electronically Signed   By: Constance Holster M.D.   On: 11/03/2019 21:35    Labs:  CBC: Recent Labs    11/03/19 1804  WBC 11.0*  HGB 14.6  HCT 43.8  PLT 253    COAGS: Recent Labs    11/03/19 1804  INR 0.9  APTT 28    BMP: Recent Labs    11/03/19 1804  NA 141  K 4.7  CL 109  CO2 24  GLUCOSE 107*  BUN 15  CALCIUM 9.9  CREATININE 1.21  GFRNONAA >60  GFRAA >60    LIVER FUNCTION TESTS: Recent Labs    11/03/19 1804  BILITOT 0.8  AST 20  ALT 27  ALKPHOS 67  PROT 6.7  ALBUMIN 4.2    TUMOR MARKERS: No results for input(s): AFPTM, CEA, CA199, CHROMGRNA in the last 8760 hours.  Assessment and Plan:  William Shields is 64 yo male with multiple CV risk factors, known PAD with prior bilateral EIA and right SFA treatment, presenting with blue toe syndrome of the left foot.   No acute limb threat.   I've reviewed his CTA performed Shields, which shows mild aortic atherosclerotic disease without ulcerated/pedunculated  plaque, and recurrent tandem stenoses of the left iliac system, including distal CIA and mid EIA.  Both of these sites have some irregular contours on the CTA, and are most likely source of emboli.    I've discussed my impression with Dr. Berdine Addison, who understands.   I also refreshed our discussion regarding smoking cessation.   We will need non-invasive Monday morning.    Plan: - admit to medicine, with initiation of heparin ggt - Pending repeat non-invasive exam - Tentative angio and possible treatment early this week - continue maximal medical therapy, including daily ASA  Thank you for this interesting consult.  I greatly enjoyed meeting William Shields and look forward to participating in their care.  A copy of this report was sent to the requesting provider on this date.  Electronically Signed: Corrie Mckusick, DO 11/03/2019,  9:52 PM   I spent a total of 55 Miinutes    in face to face in clinical consultation, greater than 50% of which was counseling/coordinating care for blue toe syndrome, possible angiogram/possible intervention

## 2019-11-03 NOTE — Consult Note (Addendum)
ED Consult    Reason for Consult:  Discolored left great toe Referring Physician:  Dr. Tamera Punt MRN #:  [id number]  History of Present Illness: This is a 64 y.o. male with history of right lower extremity claudication with risk factors hypertension, diabetes and current everyday smoking.  Previously underwent balloon angioplasty of a high-grade stenosis left external iliac artery, balloon angioplasty of a critical stenosis of right external iliac artery and treatment of right SFA disease with directional atherectomy and dcb pta.  There was thought to be a component of thromboembolic disease at that time.  He does take aspirin but apparently has discontinued statin therapy.  States that for the last 1 to 2 days he is now had discoloration of the left great toe.  Denies trauma.  This is significantly painful to him.  His sister who has recently undergone embolectomy for acute limb ischemia saw him and recommended him come to the emergency department.  Of note all of patient's previous procedures were performed by Dr. Earleen Newport with interventional radiology.   Past Medical History:  Diagnosis Date  . Diabetes mellitus without complication ([place])   . Hypertension   . PAD (peripheral artery disease) ([place])     Past Surgical History:  Procedure Laterality Date  . IR RADIOLOGIST EVAL & MGMT  03/02/2018    Allergies  Allergen Reactions  . Pollen Extract     Prior to Admission medications   Medication Sig Start Date End Date Taking? Authorizing Provider  aspirin 81 MG tablet Take 81 mg by mouth daily.    [provider]  lisinopril (PRINIVIL,ZESTRIL) 2.5 MG tablet Take 2.5 mg by mouth daily. 02/03/18   [provider]  metFORMIN (GLUCOPHAGE-XR) 500 MG 24 hr tablet Take 500 mg by mouth 2 (two) times daily. 01/28/18   [provider]  tamsulosin (FLOMAX) 0.4 MG CAPS capsule  01/19/18   [provider]    Social History   Socioeconomic History  . Marital status:  Married    Spouse name: Not on file  . Number of children: Not on file  . Years of education: Not on file  . Highest education level: Not on file  Occupational History  . Not on file  Tobacco Use  . Smoking status: Current Every Day Smoker    Packs/day: 1.00    Years: 40.00    Pack years: 40.00    Types: Cigarettes  . Smokeless tobacco: Never Used  Substance and Sexual Activity  . Alcohol use: Yes    Comment: occs.  . Drug use: Never  . Sexual activity: Not on file  Other Topics Concern  . Not on file  Social History Narrative  . Not on file   Social Determinants of Health   Financial Resource Strain:   . Difficulty of Paying Living Expenses: Not on file  Food Insecurity:   . Worried About Charity fundraiser in the Last Year: Not on file  . Ran Out of Food in the Last Year: Not on file  Transportation Needs:   . Lack of Transportation (Medical): Not on file  . Lack of Transportation (Non-Medical): Not on file  Physical Activity:   . Days of Exercise per Week: Not on file  . Minutes of Exercise per Session: Not on file  Stress:   . Feeling of Stress : Not on file  Social Connections:   . Frequency of Communication with Friends and Family: Not on file  . Frequency of Social Gatherings with  Friends and Family: Not on file  . Attends Religious Services: Not on file  . Active Member of Clubs or Organizations: Not on file  . Attends Archivist Meetings: Not on file  . Marital Status: Not on file  Intimate Partner Violence:   . Fear of Current or Ex-Partner: Not on file  . Emotionally Abused: Not on file  . Physically Abused: Not on file  . Sexually Abused: Not on file    No family history on file.  ROS:  Cardiovascular: []  chest pain/pressure []  palpitations []  SOB lying flat []  DOE []  pain in legs while walking [x]  pain in legs at rest []  pain in legs at night []  non-healing ulcers []  hx of DVT []  swelling in legs  Pulmonary: []  productive  cough []  asthma/wheezing []  home O2  Neurologic: []  weakness in []  arms []  legs []  numbness in []  arms []  legs []  hx of CVA []  mini stroke [] difficulty speaking or slurred speech []  temporary loss of vision in one eye []  dizziness  Hematologic: []  hx of cancer []  bleeding problems []  problems with blood clotting easily  Endocrine:   []  diabetes []  thyroid disease  GI []  vomiting blood []  blood in stool  GU: []  CKD/renal failure []  HD--[]  M/W/F or []  T/T/S []  burning with urination []  blood in urine  Psychiatric: []  anxiety []  depression  Musculoskeletal: []  arthritis []  joint pain  Integumentary: []  rashes []  ulcers  Constitutional: []  fever []  chills   Physical Examination  Vitals:   11/03/19 1756  BP: (!) 159/73  Pulse: 74  Resp: 20  Temp: 98.7 F (37.1 C)  SpO2: 99%   Body mass index is 23.82 kg/m.  General:  nad HENT: WNL, normocephalic Pulmonary: normal non-labored breathing Cardiac: Palpable common femoral, popliteal, posterior tibial pulses bilaterally Abdomen: soft, NT/ND, no masses Extremities: Right foot is warm and well-perfused, left great toe purple discoloration with sluggish capillary refill Musculoskeletal: no muscle wasting or atrophy  Neurologic: A&O X 3; Appropriate Affect    CBC    Component Value Date/Time   WBC 11.0 (H) 11/03/2019 1804   RBC 4.58 11/03/2019 1804   HGB 14.6 11/03/2019 1804   HCT 43.8 11/03/2019 1804   PLT 253 11/03/2019 1804   MCV 95.6 11/03/2019 1804   MCH 31.9 11/03/2019 1804   MCHC 33.3 11/03/2019 1804   RDW 11.9 11/03/2019 1804    BMET    Component Value Date/Time   NA 141 11/03/2019 1804   K 4.7 11/03/2019 1804   CL 109 11/03/2019 1804   CO2 24 11/03/2019 1804   GLUCOSE 107 (H) 11/03/2019 1804   BUN 15 11/03/2019 1804   CREATININE 1.21 11/03/2019 1804   CALCIUM 9.9 11/03/2019 1804   GFRNONAA >60 11/03/2019 1804   GFRAA >60 11/03/2019 1804    COAGS: Lab Results  Component Value  Date   INR 0.9 11/03/2019     Non-Invasive Vascular Imaging:   ABIs in 2019 were 0.67 right and 0.89 on left (preintervention)  ABIs March 2020 were 0.85/0.93  ASSESSMENT/PLAN: This is a 64 y.o. male previous bilateral inflow treatment with balloon angioplasty and right lower extremity atherectomy with drug-coated balloon angioplasty.  Now returns with recurrent similar symptoms in his left great toe.  He has palpable posterior tibial pulses bilaterally.  I have recommended CT angio chest abdomen pelvis and contact Dr. Jacqualyn Posey with interventional radiology with results.  I can be available as needed.  Kara Mierzejewski C. Donzetta Matters, MD [facility] [place] Office: [phone] Pager: [phone]

## 2019-11-04 ENCOUNTER — Observation Stay (HOSPITAL_COMMUNITY): Payer: BC Managed Care – PPO

## 2019-11-04 ENCOUNTER — Other Ambulatory Visit: Payer: Self-pay

## 2019-11-04 DIAGNOSIS — I739 Peripheral vascular disease, unspecified: Secondary | ICD-10-CM

## 2019-11-04 DIAGNOSIS — E1151 Type 2 diabetes mellitus with diabetic peripheral angiopathy without gangrene: Secondary | ICD-10-CM | POA: Diagnosis present

## 2019-11-04 DIAGNOSIS — I75021 Atheroembolism of right lower extremity: Secondary | ICD-10-CM

## 2019-11-04 DIAGNOSIS — I70212 Atherosclerosis of native arteries of extremities with intermittent claudication, left leg: Secondary | ICD-10-CM | POA: Diagnosis present

## 2019-11-04 DIAGNOSIS — Z7982 Long term (current) use of aspirin: Secondary | ICD-10-CM | POA: Diagnosis not present

## 2019-11-04 DIAGNOSIS — I743 Embolism and thrombosis of arteries of the lower extremities: Secondary | ICD-10-CM

## 2019-11-04 DIAGNOSIS — Z8349 Family history of other endocrine, nutritional and metabolic diseases: Secondary | ICD-10-CM | POA: Diagnosis not present

## 2019-11-04 DIAGNOSIS — Z20822 Contact with and (suspected) exposure to covid-19: Secondary | ICD-10-CM | POA: Diagnosis present

## 2019-11-04 DIAGNOSIS — I75022 Atheroembolism of left lower extremity: Secondary | ICD-10-CM | POA: Diagnosis not present

## 2019-11-04 DIAGNOSIS — F17210 Nicotine dependence, cigarettes, uncomplicated: Secondary | ICD-10-CM | POA: Diagnosis present

## 2019-11-04 DIAGNOSIS — I10 Essential (primary) hypertension: Secondary | ICD-10-CM | POA: Diagnosis not present

## 2019-11-04 DIAGNOSIS — Z8249 Family history of ischemic heart disease and other diseases of the circulatory system: Secondary | ICD-10-CM | POA: Diagnosis not present

## 2019-11-04 LAB — CBC
HCT: 37.4 % — ABNORMAL LOW (ref 39.0–52.0)
Hemoglobin: 12.8 g/dL — ABNORMAL LOW (ref 13.0–17.0)
MCH: 31.9 pg (ref 26.0–34.0)
MCHC: 34.2 g/dL (ref 30.0–36.0)
MCV: 93.3 fL (ref 80.0–100.0)
Platelets: 180 10*3/uL (ref 150–400)
RBC: 4.01 MIL/uL — ABNORMAL LOW (ref 4.22–5.81)
RDW: 12 % (ref 11.5–15.5)
WBC: 9.7 10*3/uL (ref 4.0–10.5)
nRBC: 0 % (ref 0.0–0.2)

## 2019-11-04 LAB — GLUCOSE, CAPILLARY
Glucose-Capillary: 147 mg/dL — ABNORMAL HIGH (ref 70–99)
Glucose-Capillary: 101 mg/dL — ABNORMAL HIGH (ref 70–99)
Glucose-Capillary: 111 mg/dL — ABNORMAL HIGH (ref 70–99)
Glucose-Capillary: 197 mg/dL — ABNORMAL HIGH (ref 70–99)

## 2019-11-04 LAB — SARS CORONAVIRUS 2 (TAT 6-24 HRS): SARS Coronavirus 2: NEGATIVE

## 2019-11-04 LAB — HEPARIN LEVEL (UNFRACTIONATED)
Heparin Unfractionated: <0.1 IU/mL — ABNORMAL LOW (ref 0.30–0.70)
Heparin Unfractionated: 0.59 IU/mL (ref 0.30–0.70)
Heparin Unfractionated: 0.63 [IU]/mL (ref 0.30–0.70)

## 2019-11-04 LAB — HIV ANTIBODY (ROUTINE TESTING W REFLEX): HIV Screen 4th Generation wRfx: NONREACTIVE

## 2019-11-04 LAB — MRSA PCR SCREENING: MRSA by PCR: NEGATIVE

## 2019-11-04 MED ORDER — INSULIN ASPART 100 UNIT/ML ~~LOC~~ SOLN: 0.0000 [IU] | Freq: Every day | SUBCUTANEOUS | Status: DC

## 2019-11-04 MED ORDER — SALINE SPRAY 0.65 % NA SOLN
1.0000 | NASAL | Status: DC | PRN
  Filled 2019-11-04: qty 44

## 2019-11-04 MED ORDER — DIPHENHYDRAMINE HCL 25 MG PO CAPS
25.0000 mg | ORAL_CAPSULE | Freq: Once | ORAL | Status: AC
  Administered 2019-11-04: 25 mg via ORAL
  Filled 2019-11-04: qty 1

## 2019-11-04 MED ORDER — INSULIN ASPART 100 UNIT/ML ~~LOC~~ SOLN
0.0000 [IU] | Freq: Three times a day (TID) | SUBCUTANEOUS | Status: DC
  Administered 2019-11-05: 2 [IU] via SUBCUTANEOUS

## 2019-11-04 MED ORDER — HEPARIN BOLUS VIA INFUSION
2000.0000 [IU] | Freq: Once | INTRAVENOUS | Status: DC
  Filled 2019-11-04: qty 2000

## 2019-11-04 NOTE — Progress Notes (Signed)
ANTICOAGULATION CONSULT NOTE - follow up consult  Pharmacy Consult for Heparin Indication: acute ischemic limb  Allergies  Allergen Reactions  . Pollen Extract Other (See Comments)    Sneezing, coughing    Patient Measurements: Height: 5\' 10"  (177.8 cm) Weight: 166 lb (75.3 kg) IBW/kg (Calculated) : 73 Heparin Dosing Weight: 75.3 kg  Vital Signs: Temp: 98.1 F (36.7 C) (02/28 1945) Temp Source: Oral (02/28 1945) BP: 159/58 (02/28 1945) Pulse Rate: 74 (02/28 1945)  Labs: Recent Labs    11/03/19 1804 11/04/19 0239 11/04/19 1220 11/04/19 1902  HGB 14.6 12.8*  --   --   HCT 43.8 37.4*  --   --   PLT 253 180  --   --   APTT 28  --   --   --   LABPROT 12.2  --   --   --   INR 0.9  --   --   --   HEPARINUNFRC  --  <0.10* 0.59 0.63  CREATININE 1.21  --   --   --     Estimated Creatinine Clearance: 65.4 mL/min (by C-G formula based on SCr of 1.21 mg/dL).   Medical History: Past Medical History:  Diagnosis Date  . Diabetes mellitus without complication ([place])   . Hypertension   . PAD (peripheral artery disease) ([facility])     Medications:  Scheduled:  . aspirin EC  81 mg Oral Daily  . heparin  2,000 Units Intravenous Once  . insulin aspart  0-15 Units Subcutaneous TID WC  . insulin aspart  0-5 Units Subcutaneous QHS  . losartan  25 mg Oral Daily  . nicotine  21 mg Transdermal Daily  . sodium chloride flush  3 mL Intravenous Once  . sodium chloride flush  3 mL Intravenous Q12H  . tamsulosin  0.4 mg Oral Daily    Assessment: 64 y.o. male with hx of PAD and R extremity claudication presenting with toe pain and found to have ischemic limb. Pharmacy has been consulted for IV heparin dosing.   Heparin level remains therapeutic at 0.63, on 1400 units/hr. Hgb 12.8, plt 180. No s/sx of bleeding or infusion issues.   Goal of Therapy:  Heparin level 0.3-0.7 units/ml Monitor platelets by anticoagulation protocol: Yes  Plan:  Continue heparin infusion at 1400  units/hr Monitor CBC, daily heparin level  Continue to monitor for signs/symptoms of bleeding F/u plans for invasive treatment   Antonietta Jewel, PharmD, BCCCP Clinical Pharmacist  Phone: [phone]  Please check AMION for all [place] phone numbers After 10:00 PM, call [place] [phone]

## 2019-11-04 NOTE — Progress Notes (Signed)
[facility] for Heparin Indication: ischemic toe  Allergies  Allergen Reactions  . Pollen Extract Other (See Comments)    Sneezing, coughing    Patient Measurements: Height: 5\' 10"  (177.8 cm) Weight: 166 lb (75.3 kg) IBW/kg (Calculated) : 73 Heparin Dosing Weight: 75.3 kg  Vital Signs: Temp: 97.6 F (36.4 C) (02/28 0330) Temp Source: Oral (02/28 0330) BP: 137/62 (02/28 0330) Pulse Rate: 65 (02/28 0330)  Labs: Recent Labs    11/03/19 1804 11/04/19 0239  HGB 14.6 12.8*  HCT 43.8 37.4*  PLT 253 180  APTT 28  --   LABPROT 12.2  --   INR 0.9  --   HEPARINUNFRC  --  <0.10*  CREATININE 1.21  --     Estimated Creatinine Clearance: 65.4 mL/min (by C-G formula based on SCr of 1.21 mg/dL).   Medical History: Past Medical History:  Diagnosis Date  . Diabetes mellitus without complication ([place])   . Hypertension   . PAD (peripheral artery disease) ([facility])     Medications:  Scheduled:  . aspirin EC  81 mg Oral Daily  . heparin  2,000 Units Intravenous Once  . insulin aspart  0-15 Units Subcutaneous TID WC  . losartan  25 mg Oral Daily  . nicotine  21 mg Transdermal Daily  . sodium chloride flush  3 mL Intravenous Once  . sodium chloride flush  3 mL Intravenous Q12H  . tamsulosin  0.4 mg Oral Daily    Assessment: Patient is a 6 yom that has a RLE claudication. Patient has a hx of previous claudications and apparently has been off his statin therapy. Pharmacy has been asked to dose heparin at this time for an acute ischemic limb.   2/28 AM update:  Initial heparin level sub-therapeutic   Goal of Therapy:  Heparin level 0.3-0.7 units/ml Monitor platelets by anticoagulation protocol: Yes   Plan:  -Heparin 2000 units re-bolus -Inc heparin to 1400 units/hr -1200 heparin level  Narda Bonds, PharmD, BCPS Clinical Pharmacist Phone: [phone]

## 2019-11-04 NOTE — Progress Notes (Signed)
VASCULAR LAB PRELIMINARY  PRELIMINARY  PRELIMINARY  PRELIMINARY  ABIs completed completed.    Preliminary report:  See CV proc for preliminary results.   Bianna Haran, RVT 11/04/2019, 4:03 PM

## 2019-11-04 NOTE — Progress Notes (Signed)
ANTICOAGULATION CONSULT NOTE - follow up consult  Pharmacy Consult for Heparin Indication: acute ischemic limb  Allergies  Allergen Reactions  . Pollen Extract Other (See Comments)    Sneezing, coughing    Patient Measurements: Height: 5\' 10"  (177.8 cm) Weight: 166 lb (75.3 kg) IBW/kg (Calculated) : 73 Heparin Dosing Weight: 75.3 kg  Vital Signs: Temp: 97.6 F (36.4 C) (02/28 0834) Temp Source: Oral (02/28 0834) BP: 147/89 (02/28 0834) Pulse Rate: 56 (02/28 0834)  Labs: Recent Labs    11/03/19 1804 11/04/19 0239 11/04/19 1220  HGB 14.6 12.8*  --   HCT 43.8 37.4*  --   PLT 253 180  --   APTT 28  --   --   LABPROT 12.2  --   --   INR 0.9  --   --   HEPARINUNFRC  --  <0.10* 0.59  CREATININE 1.21  --   --     Estimated Creatinine Clearance: 65.4 mL/min (by C-G formula based on SCr of 1.21 mg/dL).   Medical History: Past Medical History:  Diagnosis Date  . Diabetes mellitus without complication ([place])   . Hypertension   . PAD (peripheral artery disease) ([facility])     Medications:  Scheduled:  . aspirin EC  81 mg Oral Daily  . heparin  2,000 Units Intravenous Once  . insulin aspart  0-15 Units Subcutaneous TID WC  . insulin aspart  0-5 Units Subcutaneous QHS  . losartan  25 mg Oral Daily  . nicotine  21 mg Transdermal Daily  . sodium chloride flush  3 mL Intravenous Once  . sodium chloride flush  3 mL Intravenous Q12H  . tamsulosin  0.4 mg Oral Daily    Assessment: 64 y.o. male with hx of PAD and R extremity claudication presenting with toe pain and found to have ischemic limb. Pharmacy has been consulted for IV heparin dosing.   Today, heparin level is within therapeutic range at 0.59 after bolus and dose increase earlier this morning. Hgb 12.8 and platelets wnl. No issues with the infusion or overt bleeding noted per RN.  Goal of Therapy:  Heparin level 0.3-0.7 units/ml Monitor platelets by anticoagulation protocol: Yes  Plan:  Continue heparin infusion  at 1400 units/hr Obtain 6 hour confirmatory heparin level Monitor CBC, daily heparin level  Continue to monitor for signs/symptoms of bleeding F/u plans for invasive treatment    Brendolyn Patty, PharmD PGY2 Pharmacy Resident Phone [phone]

## 2019-11-04 NOTE — Plan of Care (Signed)
  Problem: Pain Managment: Goal: General experience of comfort will improve Outcome: Progressing   Problem: Safety: Goal: Ability to remain free from injury will improve Outcome: Progressing   Problem: Skin Integrity: Goal: Risk for impaired skin integrity will decrease Outcome: Progressing   

## 2019-11-04 NOTE — Progress Notes (Signed)
PROGRESS NOTE    William Shields  VU:[id number] DOB: Nov 25, 1955 DOA: 11/03/2019 PCP: Jeanie Sewer, NP    Brief Narrative:  64 y.o. male with medical history significant of of right lower extremity claudication with risk factors hypertension, diabetes and current everyday smoking. Previously underwentballoon angioplasty of a high-grade stenosis left external iliac artery, balloon angioplasty of a critical stenosis of right external iliac artery andtreatment of right SFA disease with directional atherectomy and dcb pta.There was thought to be a component of thromboembolic disease at that time. He does take aspirin but apparently has discontinued statin therapy. States that for the last 1 to 2 days he is now had discoloration of the left great toe. Denies trauma.This is significantly painful to him. His sister who has recently undergone embolectomy for acute limb ischemia saw him and recommended him come to the emergency department. Of note all of patient's previous procedures were performed by Dr. Earleen Newport with interventional radiology  ED Course: Hemodynamically stable. Vascular surgerry consult obtained. CTA chest and peripheral ordered and done. IR consult obtained. Pharmacy consult for heparinization requested. Per recommendation Vasc Surg - [facility] requested to admit patient for Heparinization and to complete evaluation for blue toe syndrome  Assessment & Plan:   Active Problems:   Blue toe syndrome of left lower extremity (HCC)   Essential hypertension   Secondary DM without complications (HCC)   PAD (peripheral artery disease) (HCC)   Tobacco abuse disorder   Blue toe syndrome of right lower extremity ([place])   1. Blue toe syndrome left  - pt with known PAD s/p several IR vascular procedures presents with discolored, cool and painful left great toe. Exam confirms poor circulation. CTA reveals PAD Plan     IR consulted and is following             Heparinzation per IR recs            IR planning non-invasive exam tomorrow             Possible IR treatment this week with recs for continued med tx including ASA  2. HTN- adequate control Plan    Consider changing from ARB to CCB at time of discharge for better effect small vessel dilitation/circulation -BP stable at this time  3. DM - on metformin Plan     A1C noted to be 7.1             Metformin while in hospital             Cont SSI as needed while in hospital. Glycemic trends stable  4. Tobacco abuse - Cessation done at time of presentation. Plan  Nicotine patch while in-patient    DVT prophylaxis: Heparin gtt Code Status: full Family Communication: Pt in room, family not at bedside Disposition Plan: from home, d/c home when cleared by IR  Consultants:   IR  Procedures:     Antimicrobials: Anti-infectives (From admission, onward)   None       Subjective: Without complaints this AM. Denies abd discomfort  Objective: Vitals:   11/03/19 2316 11/04/19 0330 11/04/19 0834 11/04/19 1500  BP: (!) 165/61 137/62 (!) 147/89 (!) 141/55  Pulse: (!) 59 65 (!) 56 (!) 55  Resp:   17 16  Temp: (!) 97.4 F (36.3 C) 97.6 F (36.4 C) 97.6 F (36.4 C) 98 F (36.7 C)  TempSrc: Oral Oral Oral Oral  SpO2: 96% 93% 99% 98%  Weight:      Height:  Intake/Output Summary (Last 24 hours) at 11/04/2019 1717 Last data filed at 11/04/2019 1652 Gross per 24 hour  Intake --  Output 1200 ml  Net -1200 ml   Filed Weights   11/03/19 1805  Weight: 75.3 kg    Examination:  General exam: Appears calm and comfortable  Respiratory system: Clear to auscultation. Respiratory effort normal. Cardiovascular system: S1 & S2 heard, Regular Gastrointestinal system: Abdomen is nondistended, pos BS Central nervous system: Alert and oriented. No focal neurological deficits. Extremities: Symmetric 5 x 5 power. Skin: No rashes, lesions  Psychiatry: Judgement and insight appear normal. Mood & affect  appropriate.   Data Reviewed: I have personally reviewed following labs and imaging studies  CBC: Recent Labs  Lab 11/03/19 1804 11/04/19 0239  WBC 11.0* 9.7  HGB 14.6 12.8*  HCT 43.8 37.4*  MCV 95.6 93.3  PLT 253 [id number]   Basic Metabolic Panel: Recent Labs  Lab 11/03/19 1804  NA 141  K 4.7  CL 109  CO2 24  GLUCOSE 107*  BUN 15  CREATININE 1.21  CALCIUM 9.9   GFR: Estimated Creatinine Clearance: 65.4 mL/min (by C-G formula based on SCr of 1.21 mg/dL). Liver Function Tests: Recent Labs  Lab 11/03/19 1804  AST 20  ALT 27  ALKPHOS 67  BILITOT 0.8  PROT 6.7  ALBUMIN 4.2   No results for input(s): LIPASE, AMYLASE in the last 168 hours. No results for input(s): AMMONIA in the last 168 hours. Coagulation Profile: Recent Labs  Lab 11/03/19 1804  INR 0.9   Cardiac Enzymes: No results for input(s): CKTOTAL, CKMB, CKMBINDEX, TROPONINI in the last 168 hours. BNP (last 3 results) No results for input(s): PROBNP in the last 8760 hours. HbA1C: Recent Labs    11/03/19 1804  HGBA1C 7.1*   CBG: Recent Labs  Lab 11/03/19 2200 11/03/19 2325 11/04/19 1035 11/04/19 1238 11/04/19 1649  GLUCAP 109* 138* 147* 101* 111*   Lipid Profile: No results for input(s): CHOL, HDL, LDLCALC, TRIG, CHOLHDL, LDLDIRECT in the last 72 hours. Thyroid Function Tests: No results for input(s): TSH, T4TOTAL, FREET4, T3FREE, THYROIDAB in the last 72 hours. Anemia Panel: No results for input(s): VITAMINB12, FOLATE, FERRITIN, TIBC, IRON, RETICCTPCT in the last 72 hours. Sepsis Labs: No results for input(s): PROCALCITON, LATICACIDVEN in the last 168 hours.  Recent Results (from the past 240 hour(s))  SARS CORONAVIRUS 2 (TAT 6-24 HRS) Nasopharyngeal Nasopharyngeal Swab     Status: None   Collection Time: 11/03/19  9:51 PM   Specimen: Nasopharyngeal Swab  Result Value Ref Range Status   SARS Coronavirus 2 NEGATIVE NEGATIVE Final    Comment: (NOTE) SARS-CoV-2 target nucleic acids are NOT  DETECTED. The SARS-CoV-2 RNA is generally detectable in upper and lower respiratory specimens during the acute phase of infection. Negative results do not preclude SARS-CoV-2 infection, do not rule out co-infections with other pathogens, and should not be used as the sole basis for treatment or other patient management decisions. Negative results must be combined with clinical observations, patient history, and epidemiological information. The expected result is Negative. Fact Sheet for Patients: SugarRoll.be Fact Sheet for Healthcare Providers: https://www.woods-mathews.com/ This test is not yet approved or cleared by the [place] FDA and  has been authorized for detection and/or diagnosis of SARS-CoV-2 by FDA under an Emergency Use Authorization (EUA). This EUA will remain  in effect (meaning this test can be used) for the duration of the COVID-19 declaration under Section 56 4(b)(1) of the Act, 21 U.S.C. section 360bbb-3(b)(1),  unless the authorization is terminated or revoked sooner. Performed at [facility], [place] [address], [place], [place] [address]   MRSA PCR Screening     Status: None   Collection Time: 11/04/19 12:50 AM   Specimen: Nasopharyngeal  Result Value Ref Range Status   MRSA by PCR NEGATIVE NEGATIVE Final    Comment:        The GeneXpert MRSA Assay (FDA approved for NASAL specimens only), is one component of a comprehensive MRSA colonization surveillance program. It is not intended to diagnose MRSA infection nor to guide or monitor treatment for MRSA infections. Performed at [facility], [place] [address] [place], [place] [address]      Radiology Studies: CT ANGIO AO+BIFEM W & OR WO CONTRAST  Result Date: 11/03/2019 CLINICAL DATA:  Great toe discoloration. EXAM: CT ANGIOGRAPHY OF ABDOMINAL AORTA WITH ILIOFEMORAL RUNOFF CT ANGIO CHEST TECHNIQUE: Multidetector CT imaging of the abdomen, pelvis and  lower extremities was performed using the standard protocol during bolus administration of intravenous contrast. Multiplanar CT image reconstructions and MIPs were obtained to evaluate the vascular anatomy. Multidetector CT imaging of the chest, abdomen, pelvis and lower extremities was performed using the standard protocol during bolus administration of intravenous contrast. Multiplanar CT image reconstructions and MIPs were obtained to evaluate the vascular anatomy. CONTRAST:  184mL OMNIPAQUE IOHEXOL 350 MG/ML SOLN COMPARISON:  None. CT angio dated March 06, 2018. FINDINGS: VASCULAR Aorta: There are atherosclerotic changes throughout the abdominal aorta without evidence for an aneurysm or dissection. Celiac: There is moderate narrowing at the origin of the celiac axis which may be secondary to atherosclerotic disease or median arcuate ligament syndrome there is some mild poststenotic dilatation. SMA: Patent without evidence of aneurysm, dissection, vasculitis or significant stenosis. Renals: There are 2 patent right renal arteries. There are 2 patent left renal arteries. IMA: Patent without evidence of aneurysm, dissection, vasculitis or significant stenosis. RIGHT Lower Extremity Inflow: There are atherosclerotic changes of the common iliac artery without evidence for high-grade stenosis. There is a less than 50% stenosis at the origin of the right external iliac artery. There is improved luminal diameter of the distal right external iliac artery when compared to prior study in 2019 consistent with a positive response to treatment. There may be an approximately 50% stenosis at this level. Outflow: There is mild narrowing of the right common femoral artery. There is mild atherosclerotic disease throughout the right profundus femoris and right superficial femoral arteries without evidence for a high-grade stenosis. The right popliteal artery is patent without evidence for high-grade stenosis. Runoff: The proximal  anterior tibial, posterior tibial, and peroneal arteries are patent. There is suboptimal opacification of the right anterior tibial and peroneal arteries at the level of the ankle. The posterior tibial artery appears to be patent. This appearance is similar to study in 2019. LEFT Lower Extremity Inflow: Atherosclerotic disease is noted involving the left common iliac artery. There is an approximately 70% stenosis in the mid to distal left common iliac artery (axial series 1, image 246). The left internal iliac artery is occluded. There is mild-to-moderate stenosis of the proximal left external iliac artery. There is a high-grade stenosis of the left external iliac artery (axial series 1, image 270). This is relatively similar to prior CT in 2019. The remaining portions of the left external iliac artery are widely patent. Outflow: There is mild narrowing of the distal left common femoral artery (axial series 1, image 311). The left profunda femoris  and left SFA are patent with mild atherosclerotic disease throughout. The left popliteal artery is widely patent. Runoff: The left anterior tibial, posterior tibial, and peroneal arteries are patent proximally. There is suboptimal opacification of the left anterior tibial and peroneal arteries at the level of the ankle. The posterior tibial artery appears to be patent. There is a patent lateral and medial plantar arch. Veins: No obvious venous abnormality within the limitations of this arterial phase study. Review of the MIP images confirms the above findings. NON-VASCULAR CT CHEST: Cardiovascular: There is no evidence for thoracic aortic aneurysm or dissection. Atherosclerotic changes are noted of the thoracic aorta. There is no evidence for an acute pulmonary embolism. The heart size is normal. The arch vessels are patent with some mild to moderate narrowing at the origin of the left common carotid artery. There are atherosclerotic changes of both common carotid arteries  without evidence for high-grade stenosis. The right vertebral artery is dominant. Mediastinum/Nodes: --No mediastinal or hilar lymphadenopathy. --No axillary lymphadenopathy. --No supraclavicular lymphadenopathy. --Normal thyroid gland. --The esophagus is unremarkable Lungs/Pleura: Emphysematous changes are noted bilaterally. There is a pulmonary nodule in the posterior right upper lobe measuring approximately 6 mm (axial series 1, image 38). There is no pneumothorax or large pleural effusion. Musculoskeletal: No chest wall abnormality. No acute or significant osseous findings. Review of the MIP images confirms the above findings. CT ABDOMEN PELVIS: Hepatobiliary: There is a cystic appearing lesion in the posterior right hepatic lobe, stable from prior study. Normal gallbladder.There is no biliary ductal dilation. Pancreas: Normal contours without ductal dilatation. No peripancreatic fluid collection. Spleen: No splenic laceration or hematoma. Adrenals/Urinary Tract: --Adrenal glands: No adrenal hemorrhage. --Right kidney/ureter: No hydronephrosis or perinephric hematoma. --Left kidney/ureter: No hydronephrosis or perinephric hematoma. --Urinary bladder: Unremarkable. Stomach/Bowel: --Stomach/Duodenum: There is a large duodenal diverticulum. --Small bowel: No dilatation or inflammation. --Colon: Rectosigmoid diverticulosis without acute inflammation. --Appendix: Normal. Lymphatic: --No retroperitoneal lymphadenopathy. --No mesenteric lymphadenopathy. --No pelvic or inguinal lymphadenopathy. Reproductive: Unremarkable Other: No ascites or free air. There are bilateral fat containing inguinal hernias, right greater than left. Musculoskeletal. No acute displaced fractures. IMPRESSION: 1. No evidence for an acute vascular abnormality. No evidence for dissection. No acute pulmonary embolism. No aortic aneurysm. No evidence for an acute arterial embolism. 2. There is a mild stenosis of the distal right external iliac  artery, significantly improved from prior study in 2019. Atherosclerotic disease is noted throughout the right superficial femoral artery resulting in mild-to-moderate narrowing and no evidence for a high-grade stenosis. There is a similar single vessel runoff via the posterior tibial artery on the right. There is questionable opacification of the peroneal and anterior tibial arteries at the level of the ankle. 3. Atherosclerotic changes are noted of the left common iliac artery resulting in approximately 70% stenosis. There is persistent moderate narrowing of the proximal left external iliac artery, slightly improved since 2019. Mild-to-moderate narrowing is noted of the left distal common femoral artery. Again noted is an essentially single vessel runoff of the left lower extremity via the posterior tibial artery. There is minimal opacification of the posterior tibial and peroneal arteries at the level of the ankle. 4. There is a 6 mm pulmonary nodule in the right upper lobe. Non-contrast chest CT at 3-6 months is recommended. If the nodules are stable at time of repeat CT, then future CT at 18-24 months (from today's scan) is considered optional for low-risk patients, but is recommended for high-risk patients. This recommendation follows the consensus statement: Guidelines  for Management of Incidental Pulmonary Nodules Detected on CT Images: From the Fleischner Society 2017; Radiology 2017; 284:228-243. 5. 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 6. There is diverticulosis without CT evidence for diverticulitis. 7. Aortic Atherosclerosis (ICD10-I70.0) and Emphysema (ICD10-J43.9). Electronically Signed   By: Constance Holster M.D.   On: 11/03/2019 21:35   CT ANGIO CHEST AORTA W/CM &/OR WO/CM  Result Date: 11/03/2019 CLINICAL DATA:  Great toe discoloration. EXAM: CT ANGIOGRAPHY OF ABDOMINAL AORTA WITH ILIOFEMORAL RUNOFF CT ANGIO CHEST TECHNIQUE: Multidetector CT imaging of  the abdomen, pelvis and lower extremities was performed using the standard protocol during bolus administration of intravenous contrast. Multiplanar CT image reconstructions and MIPs were obtained to evaluate the vascular anatomy. Multidetector CT imaging of the chest, abdomen, pelvis and lower extremities was performed using the standard protocol during bolus administration of intravenous contrast. Multiplanar CT image reconstructions and MIPs were obtained to evaluate the vascular anatomy. CONTRAST:  19mL OMNIPAQUE IOHEXOL 350 MG/ML SOLN COMPARISON:  None. CT angio dated March 06, 2018. FINDINGS: VASCULAR Aorta: There are atherosclerotic changes throughout the abdominal aorta without evidence for an aneurysm or dissection. Celiac: There is moderate narrowing at the origin of the celiac axis which may be secondary to atherosclerotic disease or median arcuate ligament syndrome there is some mild poststenotic dilatation. SMA: Patent without evidence of aneurysm, dissection, vasculitis or significant stenosis. Renals: There are 2 patent right renal arteries. There are 2 patent left renal arteries. IMA: Patent without evidence of aneurysm, dissection, vasculitis or significant stenosis. RIGHT Lower Extremity Inflow: There are atherosclerotic changes of the common iliac artery without evidence for high-grade stenosis. There is a less than 50% stenosis at the origin of the right external iliac artery. There is improved luminal diameter of the distal right external iliac artery when compared to prior study in 2019 consistent with a positive response to treatment. There may be an approximately 50% stenosis at this level. Outflow: There is mild narrowing of the right common femoral artery. There is mild atherosclerotic disease throughout the right profundus femoris and right superficial femoral arteries without evidence for a high-grade stenosis. The right popliteal artery is patent without evidence for high-grade stenosis.  Runoff: The proximal anterior tibial, posterior tibial, and peroneal arteries are patent. There is suboptimal opacification of the right anterior tibial and peroneal arteries at the level of the ankle. The posterior tibial artery appears to be patent. This appearance is similar to study in 2019. LEFT Lower Extremity Inflow: Atherosclerotic disease is noted involving the left common iliac artery. There is an approximately 70% stenosis in the mid to distal left common iliac artery (axial series 1, image 246). The left internal iliac artery is occluded. There is mild-to-moderate stenosis of the proximal left external iliac artery. There is a high-grade stenosis of the left external iliac artery (axial series 1, image 270). This is relatively similar to prior CT in 2019. The remaining portions of the left external iliac artery are widely patent. Outflow: There is mild narrowing of the distal left common femoral artery (axial series 1, image 311). The left profunda femoris and left SFA are patent with mild atherosclerotic disease throughout. The left popliteal artery is widely patent. Runoff: The left anterior tibial, posterior tibial, and peroneal arteries are patent proximally. There is suboptimal opacification of the left anterior tibial and peroneal arteries at the level of the ankle. The posterior tibial artery appears to be patent. There is a patent lateral and  medial plantar arch. Veins: No obvious venous abnormality within the limitations of this arterial phase study. Review of the MIP images confirms the above findings. NON-VASCULAR CT CHEST: Cardiovascular: There is no evidence for thoracic aortic aneurysm or dissection. Atherosclerotic changes are noted of the thoracic aorta. There is no evidence for an acute pulmonary embolism. The heart size is normal. The arch vessels are patent with some mild to moderate narrowing at the origin of the left common carotid artery. There are atherosclerotic changes of both  common carotid arteries without evidence for high-grade stenosis. The right vertebral artery is dominant. Mediastinum/Nodes: --No mediastinal or hilar lymphadenopathy. --No axillary lymphadenopathy. --No supraclavicular lymphadenopathy. --Normal thyroid gland. --The esophagus is unremarkable Lungs/Pleura: Emphysematous changes are noted bilaterally. There is a pulmonary nodule in the posterior right upper lobe measuring approximately 6 mm (axial series 1, image 38). There is no pneumothorax or large pleural effusion. Musculoskeletal: No chest wall abnormality. No acute or significant osseous findings. Review of the MIP images confirms the above findings. CT ABDOMEN PELVIS: Hepatobiliary: There is a cystic appearing lesion in the posterior right hepatic lobe, stable from prior study. Normal gallbladder.There is no biliary ductal dilation. Pancreas: Normal contours without ductal dilatation. No peripancreatic fluid collection. Spleen: No splenic laceration or hematoma. Adrenals/Urinary Tract: --Adrenal glands: No adrenal hemorrhage. --Right kidney/ureter: No hydronephrosis or perinephric hematoma. --Left kidney/ureter: No hydronephrosis or perinephric hematoma. --Urinary bladder: Unremarkable. Stomach/Bowel: --Stomach/Duodenum: There is a large duodenal diverticulum. --Small bowel: No dilatation or inflammation. --Colon: Rectosigmoid diverticulosis without acute inflammation. --Appendix: Normal. Lymphatic: --No retroperitoneal lymphadenopathy. --No mesenteric lymphadenopathy. --No pelvic or inguinal lymphadenopathy. Reproductive: Unremarkable Other: No ascites or free air. There are bilateral fat containing inguinal hernias, right greater than left. Musculoskeletal. No acute displaced fractures. IMPRESSION: 1. No evidence for an acute vascular abnormality. No evidence for dissection. No acute pulmonary embolism. No aortic aneurysm. No evidence for an acute arterial embolism. 2. There is a mild stenosis of the distal  right external iliac artery, significantly improved from prior study in 2019. Atherosclerotic disease is noted throughout the right superficial femoral artery resulting in mild-to-moderate narrowing and no evidence for a high-grade stenosis. There is a similar single vessel runoff via the posterior tibial artery on the right. There is questionable opacification of the peroneal and anterior tibial arteries at the level of the ankle. 3. Atherosclerotic changes are noted of the left common iliac artery resulting in approximately 70% stenosis. There is persistent moderate narrowing of the proximal left external iliac artery, slightly improved since 2019. Mild-to-moderate narrowing is noted of the left distal common femoral artery. Again noted is an essentially single vessel runoff of the left lower extremity via the posterior tibial artery. There is minimal opacification of the posterior tibial and peroneal arteries at the level of the ankle. 4. There is a 6 mm pulmonary nodule in the right upper lobe. Non-contrast chest CT at 3-6 months is recommended. If the nodules are stable at time of repeat CT, then future CT at 18-24 months (from today's scan) is considered optional for low-risk patients, but is recommended for high-risk patients. This recommendation follows the consensus statement: Guidelines for Management of Incidental Pulmonary Nodules Detected on CT Images: From the Fleischner Society 2017; Radiology 2017; 284:228-243. 5. 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 6. There is diverticulosis without CT evidence for diverticulitis. 7. Aortic Atherosclerosis (ICD10-I70.0) and Emphysema (ICD10-J43.9). Electronically Signed   By: Constance Holster M.D.   On: 11/03/2019 21:35  VAS [place] ABI WITH/WO TBI  Result Date: 11/04/2019 LOWER EXTREMITY DOPPLER STUDY Indications: Peripheral artery disease, and Blue toe syndrome. High Risk Factors: Hypertension, Diabetes, current smoker.   Vascular Interventions: Balloon angioplasty of left external iliac artery. Comparison Study: No prior study on file Performing Technologist: Sharion Dove RVS  Examination Guidelines: A complete evaluation includes at minimum, Doppler waveform signals and systolic blood pressure reading at the level of bilateral brachial, anterior tibial, and posterior tibial arteries, when vessel segments are accessible. Bilateral testing is considered an integral part of a complete examination. Photoelectric Plethysmograph (PPG) waveforms and toe systolic pressure readings are included as required and additional duplex testing as needed. Limited examinations for reoccurring indications may be performed as noted.  ABI Findings: +---------+------------------+-----+---------+--------+ Right    Rt Pressure (mmHg)IndexWaveform Comment  +---------+------------------+-----+---------+--------+ Brachial 170                    triphasic         +---------+------------------+-----+---------+--------+ PTA      179               1.03 biphasic          +---------+------------------+-----+---------+--------+ DP       166               0.96 biphasic          +---------+------------------+-----+---------+--------+ Great Toe112               0.65                   +---------+------------------+-----+---------+--------+ +---------+------------------+-----+---------+-------+ Left     Lt Pressure (mmHg)IndexWaveform Comment +---------+------------------+-----+---------+-------+ Brachial 173                    triphasic        +---------+------------------+-----+---------+-------+ ATA      131               0.76 biphasic         +---------+------------------+-----+---------+-------+ DP       118               0.68 biphasic         +---------+------------------+-----+---------+-------+ Great Toe50                0.29                  +---------+------------------+-----+---------+-------+  +-------+-----------+-----------+------------+------------+ ABI/TBIToday's ABIToday's TBIPrevious ABIPrevious TBI +-------+-----------+-----------+------------+------------+ Right  1.03       0.65                                +-------+-----------+-----------+------------+------------+ Left   0.68       0.29                                +-------+-----------+-----------+------------+------------+  Summary: Right: Resting right ankle-brachial index is within normal range. No evidence of significant right lower extremity arterial disease. The right toe-brachial index is abnormal. Left: Resting left ankle-brachial index indicates moderate left lower extremity arterial disease. The left toe-brachial index is abnormal.  *See table(s) above for measurements and observations.     Preliminary     Scheduled Meds: . aspirin EC  81 mg Oral Daily  . heparin  2,000 Units Intravenous Once  . insulin aspart  0-15 Units Subcutaneous TID WC  . insulin  aspart  0-5 Units Subcutaneous QHS  . losartan  25 mg Oral Daily  . nicotine  21 mg Transdermal Daily  . sodium chloride flush  3 mL Intravenous Once  . sodium chloride flush  3 mL Intravenous Q12H  . tamsulosin  0.4 mg Oral Daily   Continuous Infusions: . sodium chloride 250 mL (11/04/19 0116)  . heparin 1,400 Units/hr (11/04/19 1532)     LOS: 0 days   Marylu Lund, MD [facility] Pager On Amion  If 7PM-7AM, please contact night-coverage 11/04/2019, 5:17 PM

## 2019-11-04 NOTE — Progress Notes (Signed)
Notified X. Blount that pt is requesting Benadryl to help him sleep. Usually take 1 capsule at home as needed. Will continue to monitor.

## 2019-11-05 LAB — CBC
HCT: 38.3 % — ABNORMAL LOW (ref 39.0–52.0)
Hemoglobin: 13.1 g/dL (ref 13.0–17.0)
MCH: 32.3 pg (ref 26.0–34.0)
MCHC: 34.2 g/dL (ref 30.0–36.0)
MCV: 94.3 fL (ref 80.0–100.0)
Platelets: 190 10*3/uL (ref 150–400)
RBC: 4.06 MIL/uL — ABNORMAL LOW (ref 4.22–5.81)
RDW: 11.9 % (ref 11.5–15.5)
WBC: 9.8 10*3/uL (ref 4.0–10.5)
nRBC: 0 % (ref 0.0–0.2)

## 2019-11-05 LAB — GLUCOSE, CAPILLARY
Glucose-Capillary: 119 mg/dL — ABNORMAL HIGH (ref 70–99)
Glucose-Capillary: 149 mg/dL — ABNORMAL HIGH (ref 70–99)
Glucose-Capillary: 113 mg/dL — ABNORMAL HIGH (ref 70–99)
Glucose-Capillary: 149 mg/dL — ABNORMAL HIGH (ref 70–99)

## 2019-11-05 LAB — HEPARIN LEVEL (UNFRACTIONATED): Heparin Unfractionated: 0.68 IU/mL (ref 0.30–0.70)

## 2019-11-05 NOTE — Progress Notes (Signed)
PROGRESS NOTE    William Shields  UG:[id number] DOB: 02/07/1956 DOA: 11/03/2019 PCP: Jeanie Sewer, NP    Brief Narrative:  64 y.o. male with medical history significant of of right lower extremity claudication with risk factors hypertension, diabetes and current everyday smoking. Previously underwentballoon angioplasty of a high-grade stenosis left external iliac artery, balloon angioplasty of a critical stenosis of right external iliac artery andtreatment of right SFA disease with directional atherectomy and dcb pta.There was thought to be a component of thromboembolic disease at that time. He does take aspirin but apparently has discontinued statin therapy. States that for the last 1 to 2 days he is now had discoloration of the left great toe. Denies trauma.This is significantly painful to him. His sister who has recently undergone embolectomy for acute limb ischemia saw him and recommended him come to the emergency department. Of note all of patient's previous procedures were performed by Dr. Earleen Newport with interventional radiology  ED Course: Hemodynamically stable. Vascular surgerry consult obtained. CTA chest and peripheral ordered and done. IR consult obtained. Pharmacy consult for heparinization requested. Per recommendation Vasc Surg - [facility] requested to admit patient for Heparinization and to complete evaluation for blue toe syndrome  Assessment & Plan:   Active Problems:   Blue toe syndrome of left lower extremity (HCC)   Essential hypertension   Secondary DM without complications (HCC)   PAD (peripheral artery disease) (HCC)   Tobacco abuse disorder   Blue toe syndrome of right lower extremity ([place])   1. Blue toe syndrome left  - pt with known PAD s/p several IR vascular procedures presents with discolored, cool and painful left great toe. Exam confirms poor circulation. CTA reveals PAD Plan     IR consulted and is following             Heparinzation per IR recs            Pt s/p ABI's this AM with finding of mod LLE arterial disease             Plan for IR intervention tomorrow with sedation, NPO after midnight  2. HTN- adequate control Plan    Consider changing from ARB to CCB at time of discharge for better effect small vessel dilitation/circulation -BP remains stable  3. DM - on metformin Plan     A1C noted to be 7.1             Metformin on hold while in hospital             Cont SSI as needed while in hospital. Glycemic trends stable at this time  4. Tobacco abuse - Cessation done at time of presentation. Plan  Nicotine patch while pt is in-patient    DVT prophylaxis: Heparin gtt Code Status: full Family Communication: Pt in room, family not at bedside Disposition Plan: from home, d/c home when cleared by IR  Consultants:   IR  Procedures:     Antimicrobials: Anti-infectives (From admission, onward)   None      Subjective: No abd pain or sob  Objective: Vitals:   11/05/19 0442 11/05/19 0724 11/05/19 1409 11/05/19 1525  BP: (!) 154/68 (!) 145/62 (!) 145/53   Pulse: 79 61 (!) 57 64  Resp:  18 18   Temp: 98 F (36.7 C) 97.9 F (36.6 C) 98.1 F (36.7 C)   TempSrc: Oral Oral Oral   SpO2: 95% 94% 98%   Weight:      Height:  Intake/Output Summary (Last 24 hours) at 11/05/2019 1637 Last data filed at 11/05/2019 1500 Gross per 24 hour  Intake 480 ml  Output 2200 ml  Net -1720 ml   Filed Weights   11/03/19 1805  Weight: 75.3 kg    Examination: General exam: Awake, laying in bed, in nad Respiratory system: Normal respiratory effort, no wheezing Cardiovascular system: regular rate, s1, s2 Gastrointestinal system: Soft, nondistended, positive BS Central nervous system: CN2-12 grossly intact, strength intact Extremities: Perfused, no clubbing Skin: Normal skin turgor, no notable skin lesions seen Psychiatry: Mood normal // no visual hallucinations   Data Reviewed: I have personally reviewed following  labs and imaging studies  CBC: Recent Labs  Lab 11/03/19 1804 11/04/19 0239 11/05/19 0136  WBC 11.0* 9.7 9.8  HGB 14.6 12.8* 13.1  HCT 43.8 37.4* 38.3*  MCV 95.6 93.3 94.3  PLT 253 180 [id number]   Basic Metabolic Panel: Recent Labs  Lab 11/03/19 1804  NA 141  K 4.7  CL 109  CO2 24  GLUCOSE 107*  BUN 15  CREATININE 1.21  CALCIUM 9.9   GFR: Estimated Creatinine Clearance: 65.4 mL/min (by C-G formula based on SCr of 1.21 mg/dL). Liver Function Tests: Recent Labs  Lab 11/03/19 1804  AST 20  ALT 27  ALKPHOS 67  BILITOT 0.8  PROT 6.7  ALBUMIN 4.2   No results for input(s): LIPASE, AMYLASE in the last 168 hours. No results for input(s): AMMONIA in the last 168 hours. Coagulation Profile: Recent Labs  Lab 11/03/19 1804  INR 0.9   Cardiac Enzymes: No results for input(s): CKTOTAL, CKMB, CKMBINDEX, TROPONINI in the last 168 hours. BNP (last 3 results) No results for input(s): PROBNP in the last 8760 hours. HbA1C: Recent Labs    11/03/19 1804  HGBA1C 7.1*   CBG: Recent Labs  Lab 11/04/19 1649 11/04/19 2306 11/05/19 0640 11/05/19 1109 11/05/19 1608  GLUCAP 111* 197* 119* 149* 113*   Lipid Profile: No results for input(s): CHOL, HDL, LDLCALC, TRIG, CHOLHDL, LDLDIRECT in the last 72 hours. Thyroid Function Tests: No results for input(s): TSH, T4TOTAL, FREET4, T3FREE, THYROIDAB in the last 72 hours. Anemia Panel: No results for input(s): VITAMINB12, FOLATE, FERRITIN, TIBC, IRON, RETICCTPCT in the last 72 hours. Sepsis Labs: No results for input(s): PROCALCITON, LATICACIDVEN in the last 168 hours.  Recent Results (from the past 240 hour(s))  SARS CORONAVIRUS 2 (TAT 6-24 HRS) Nasopharyngeal Nasopharyngeal Swab     Status: None   Collection Time: 11/03/19  9:51 PM   Specimen: Nasopharyngeal Swab  Result Value Ref Range Status   SARS Coronavirus 2 NEGATIVE NEGATIVE Final    Comment: (NOTE) SARS-CoV-2 target nucleic acids are NOT DETECTED. The SARS-CoV-2 RNA  is generally detectable in upper and lower respiratory specimens during the acute phase of infection. Negative results do not preclude SARS-CoV-2 infection, do not rule out co-infections with other pathogens, and should not be used as the sole basis for treatment or other patient management decisions. Negative results must be combined with clinical observations, patient history, and epidemiological information. The expected result is Negative. Fact Sheet for Patients: SugarRoll.be Fact Sheet for Healthcare Providers: https://www.woods-mathews.com/ This test is not yet approved or cleared by the [place] FDA and  has been authorized for detection and/or diagnosis of SARS-CoV-2 by FDA under an Emergency Use Authorization (EUA). This EUA will remain  in effect (meaning this test can be used) for the duration of the COVID-19 declaration under Section 56 4(b)(1) of the Act, 21 U.S.C.  section 360bbb-3(b)(1), unless the authorization is terminated or revoked sooner. Performed at [facility], [place] [address] [place], [place] [address]   MRSA PCR Screening     Status: None   Collection Time: 11/04/19 12:50 AM   Specimen: Nasopharyngeal  Result Value Ref Range Status   MRSA by PCR NEGATIVE NEGATIVE Final    Comment:        The GeneXpert MRSA Assay (FDA approved for NASAL specimens only), is one component of a comprehensive MRSA colonization surveillance program. It is not intended to diagnose MRSA infection nor to guide or monitor treatment for MRSA infections. Performed at [facility], [place] [address], [place], [place] [address]      Radiology Studies: CT ANGIO AO+BIFEM W & OR WO CONTRAST  Result Date: 11/03/2019 CLINICAL DATA:  Great toe discoloration. EXAM: CT ANGIOGRAPHY OF ABDOMINAL AORTA WITH ILIOFEMORAL RUNOFF CT ANGIO CHEST TECHNIQUE: Multidetector CT imaging of the abdomen, pelvis and lower extremities was performed  using the standard protocol during bolus administration of intravenous contrast. Multiplanar CT image reconstructions and MIPs were obtained to evaluate the vascular anatomy. Multidetector CT imaging of the chest, abdomen, pelvis and lower extremities was performed using the standard protocol during bolus administration of intravenous contrast. Multiplanar CT image reconstructions and MIPs were obtained to evaluate the vascular anatomy. CONTRAST:  132mL OMNIPAQUE IOHEXOL 350 MG/ML SOLN COMPARISON:  None. CT angio dated March 06, 2018. FINDINGS: VASCULAR Aorta: There are atherosclerotic changes throughout the abdominal aorta without evidence for an aneurysm or dissection. Celiac: There is moderate narrowing at the origin of the celiac axis which may be secondary to atherosclerotic disease or median arcuate ligament syndrome there is some mild poststenotic dilatation. SMA: Patent without evidence of aneurysm, dissection, vasculitis or significant stenosis. Renals: There are 2 patent right renal arteries. There are 2 patent left renal arteries. IMA: Patent without evidence of aneurysm, dissection, vasculitis or significant stenosis. RIGHT Lower Extremity Inflow: There are atherosclerotic changes of the common iliac artery without evidence for high-grade stenosis. There is a less than 50% stenosis at the origin of the right external iliac artery. There is improved luminal diameter of the distal right external iliac artery when compared to prior study in 2019 consistent with a positive response to treatment. There may be an approximately 50% stenosis at this level. Outflow: There is mild narrowing of the right common femoral artery. There is mild atherosclerotic disease throughout the right profundus femoris and right superficial femoral arteries without evidence for a high-grade stenosis. The right popliteal artery is patent without evidence for high-grade stenosis. Runoff: The proximal anterior tibial, posterior tibial,  and peroneal arteries are patent. There is suboptimal opacification of the right anterior tibial and peroneal arteries at the level of the ankle. The posterior tibial artery appears to be patent. This appearance is similar to study in 2019. LEFT Lower Extremity Inflow: Atherosclerotic disease is noted involving the left common iliac artery. There is an approximately 70% stenosis in the mid to distal left common iliac artery (axial series 1, image 246). The left internal iliac artery is occluded. There is mild-to-moderate stenosis of the proximal left external iliac artery. There is a high-grade stenosis of the left external iliac artery (axial series 1, image 270). This is relatively similar to prior CT in 2019. The remaining portions of the left external iliac artery are widely patent. Outflow: There is mild narrowing of the distal left common femoral artery (axial series 1, image 311). The left  profunda femoris and left SFA are patent with mild atherosclerotic disease throughout. The left popliteal artery is widely patent. Runoff: The left anterior tibial, posterior tibial, and peroneal arteries are patent proximally. There is suboptimal opacification of the left anterior tibial and peroneal arteries at the level of the ankle. The posterior tibial artery appears to be patent. There is a patent lateral and medial plantar arch. Veins: No obvious venous abnormality within the limitations of this arterial phase study. Review of the MIP images confirms the above findings. NON-VASCULAR CT CHEST: Cardiovascular: There is no evidence for thoracic aortic aneurysm or dissection. Atherosclerotic changes are noted of the thoracic aorta. There is no evidence for an acute pulmonary embolism. The heart size is normal. The arch vessels are patent with some mild to moderate narrowing at the origin of the left common carotid artery. There are atherosclerotic changes of both common carotid arteries without evidence for high-grade  stenosis. The right vertebral artery is dominant. Mediastinum/Nodes: --No mediastinal or hilar lymphadenopathy. --No axillary lymphadenopathy. --No supraclavicular lymphadenopathy. --Normal thyroid gland. --The esophagus is unremarkable Lungs/Pleura: Emphysematous changes are noted bilaterally. There is a pulmonary nodule in the posterior right upper lobe measuring approximately 6 mm (axial series 1, image 38). There is no pneumothorax or large pleural effusion. Musculoskeletal: No chest wall abnormality. No acute or significant osseous findings. Review of the MIP images confirms the above findings. CT ABDOMEN PELVIS: Hepatobiliary: There is a cystic appearing lesion in the posterior right hepatic lobe, stable from prior study. Normal gallbladder.There is no biliary ductal dilation. Pancreas: Normal contours without ductal dilatation. No peripancreatic fluid collection. Spleen: No splenic laceration or hematoma. Adrenals/Urinary Tract: --Adrenal glands: No adrenal hemorrhage. --Right kidney/ureter: No hydronephrosis or perinephric hematoma. --Left kidney/ureter: No hydronephrosis or perinephric hematoma. --Urinary bladder: Unremarkable. Stomach/Bowel: --Stomach/Duodenum: There is a large duodenal diverticulum. --Small bowel: No dilatation or inflammation. --Colon: Rectosigmoid diverticulosis without acute inflammation. --Appendix: Normal. Lymphatic: --No retroperitoneal lymphadenopathy. --No mesenteric lymphadenopathy. --No pelvic or inguinal lymphadenopathy. Reproductive: Unremarkable Other: No ascites or free air. There are bilateral fat containing inguinal hernias, right greater than left. Musculoskeletal. No acute displaced fractures. IMPRESSION: 1. No evidence for an acute vascular abnormality. No evidence for dissection. No acute pulmonary embolism. No aortic aneurysm. No evidence for an acute arterial embolism. 2. There is a mild stenosis of the distal right external iliac artery, significantly improved from  prior study in 2019. Atherosclerotic disease is noted throughout the right superficial femoral artery resulting in mild-to-moderate narrowing and no evidence for a high-grade stenosis. There is a similar single vessel runoff via the posterior tibial artery on the right. There is questionable opacification of the peroneal and anterior tibial arteries at the level of the ankle. 3. Atherosclerotic changes are noted of the left common iliac artery resulting in approximately 70% stenosis. There is persistent moderate narrowing of the proximal left external iliac artery, slightly improved since 2019. Mild-to-moderate narrowing is noted of the left distal common femoral artery. Again noted is an essentially single vessel runoff of the left lower extremity via the posterior tibial artery. There is minimal opacification of the posterior tibial and peroneal arteries at the level of the ankle. 4. There is a 6 mm pulmonary nodule in the right upper lobe. Non-contrast chest CT at 3-6 months is recommended. If the nodules are stable at time of repeat CT, then future CT at 18-24 months (from today's scan) is considered optional for low-risk patients, but is recommended for high-risk patients. This recommendation follows the consensus  statement: Guidelines for Management of Incidental Pulmonary Nodules Detected on CT Images: From the Fleischner Society 2017; Radiology 2017; 284:228-243. 5. 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 6. There is diverticulosis without CT evidence for diverticulitis. 7. Aortic Atherosclerosis (ICD10-I70.0) and Emphysema (ICD10-J43.9). Electronically Signed   By: Constance Holster M.D.   On: 11/03/2019 21:35   CT ANGIO CHEST AORTA W/CM &/OR WO/CM  Result Date: 11/03/2019 CLINICAL DATA:  Great toe discoloration. EXAM: CT ANGIOGRAPHY OF ABDOMINAL AORTA WITH ILIOFEMORAL RUNOFF CT ANGIO CHEST TECHNIQUE: Multidetector CT imaging of the abdomen, pelvis and lower  extremities was performed using the standard protocol during bolus administration of intravenous contrast. Multiplanar CT image reconstructions and MIPs were obtained to evaluate the vascular anatomy. Multidetector CT imaging of the chest, abdomen, pelvis and lower extremities was performed using the standard protocol during bolus administration of intravenous contrast. Multiplanar CT image reconstructions and MIPs were obtained to evaluate the vascular anatomy. CONTRAST:  162mL OMNIPAQUE IOHEXOL 350 MG/ML SOLN COMPARISON:  None. CT angio dated March 06, 2018. FINDINGS: VASCULAR Aorta: There are atherosclerotic changes throughout the abdominal aorta without evidence for an aneurysm or dissection. Celiac: There is moderate narrowing at the origin of the celiac axis which may be secondary to atherosclerotic disease or median arcuate ligament syndrome there is some mild poststenotic dilatation. SMA: Patent without evidence of aneurysm, dissection, vasculitis or significant stenosis. Renals: There are 2 patent right renal arteries. There are 2 patent left renal arteries. IMA: Patent without evidence of aneurysm, dissection, vasculitis or significant stenosis. RIGHT Lower Extremity Inflow: There are atherosclerotic changes of the common iliac artery without evidence for high-grade stenosis. There is a less than 50% stenosis at the origin of the right external iliac artery. There is improved luminal diameter of the distal right external iliac artery when compared to prior study in 2019 consistent with a positive response to treatment. There may be an approximately 50% stenosis at this level. Outflow: There is mild narrowing of the right common femoral artery. There is mild atherosclerotic disease throughout the right profundus femoris and right superficial femoral arteries without evidence for a high-grade stenosis. The right popliteal artery is patent without evidence for high-grade stenosis. Runoff: The proximal anterior  tibial, posterior tibial, and peroneal arteries are patent. There is suboptimal opacification of the right anterior tibial and peroneal arteries at the level of the ankle. The posterior tibial artery appears to be patent. This appearance is similar to study in 2019. LEFT Lower Extremity Inflow: Atherosclerotic disease is noted involving the left common iliac artery. There is an approximately 70% stenosis in the mid to distal left common iliac artery (axial series 1, image 246). The left internal iliac artery is occluded. There is mild-to-moderate stenosis of the proximal left external iliac artery. There is a high-grade stenosis of the left external iliac artery (axial series 1, image 270). This is relatively similar to prior CT in 2019. The remaining portions of the left external iliac artery are widely patent. Outflow: There is mild narrowing of the distal left common femoral artery (axial series 1, image 311). The left profunda femoris and left SFA are patent with mild atherosclerotic disease throughout. The left popliteal artery is widely patent. Runoff: The left anterior tibial, posterior tibial, and peroneal arteries are patent proximally. There is suboptimal opacification of the left anterior tibial and peroneal arteries at the level of the ankle. The posterior tibial artery appears to be patent. There is a patent  lateral and medial plantar arch. Veins: No obvious venous abnormality within the limitations of this arterial phase study. Review of the MIP images confirms the above findings. NON-VASCULAR CT CHEST: Cardiovascular: There is no evidence for thoracic aortic aneurysm or dissection. Atherosclerotic changes are noted of the thoracic aorta. There is no evidence for an acute pulmonary embolism. The heart size is normal. The arch vessels are patent with some mild to moderate narrowing at the origin of the left common carotid artery. There are atherosclerotic changes of both common carotid arteries without  evidence for high-grade stenosis. The right vertebral artery is dominant. Mediastinum/Nodes: --No mediastinal or hilar lymphadenopathy. --No axillary lymphadenopathy. --No supraclavicular lymphadenopathy. --Normal thyroid gland. --The esophagus is unremarkable Lungs/Pleura: Emphysematous changes are noted bilaterally. There is a pulmonary nodule in the posterior right upper lobe measuring approximately 6 mm (axial series 1, image 38). There is no pneumothorax or large pleural effusion. Musculoskeletal: No chest wall abnormality. No acute or significant osseous findings. Review of the MIP images confirms the above findings. CT ABDOMEN PELVIS: Hepatobiliary: There is a cystic appearing lesion in the posterior right hepatic lobe, stable from prior study. Normal gallbladder.There is no biliary ductal dilation. Pancreas: Normal contours without ductal dilatation. No peripancreatic fluid collection. Spleen: No splenic laceration or hematoma. Adrenals/Urinary Tract: --Adrenal glands: No adrenal hemorrhage. --Right kidney/ureter: No hydronephrosis or perinephric hematoma. --Left kidney/ureter: No hydronephrosis or perinephric hematoma. --Urinary bladder: Unremarkable. Stomach/Bowel: --Stomach/Duodenum: There is a large duodenal diverticulum. --Small bowel: No dilatation or inflammation. --Colon: Rectosigmoid diverticulosis without acute inflammation. --Appendix: Normal. Lymphatic: --No retroperitoneal lymphadenopathy. --No mesenteric lymphadenopathy. --No pelvic or inguinal lymphadenopathy. Reproductive: Unremarkable Other: No ascites or free air. There are bilateral fat containing inguinal hernias, right greater than left. Musculoskeletal. No acute displaced fractures. IMPRESSION: 1. No evidence for an acute vascular abnormality. No evidence for dissection. No acute pulmonary embolism. No aortic aneurysm. No evidence for an acute arterial embolism. 2. There is a mild stenosis of the distal right external iliac artery,  significantly improved from prior study in 2019. Atherosclerotic disease is noted throughout the right superficial femoral artery resulting in mild-to-moderate narrowing and no evidence for a high-grade stenosis. There is a similar single vessel runoff via the posterior tibial artery on the right. There is questionable opacification of the peroneal and anterior tibial arteries at the level of the ankle. 3. Atherosclerotic changes are noted of the left common iliac artery resulting in approximately 70% stenosis. There is persistent moderate narrowing of the proximal left external iliac artery, slightly improved since 2019. Mild-to-moderate narrowing is noted of the left distal common femoral artery. Again noted is an essentially single vessel runoff of the left lower extremity via the posterior tibial artery. There is minimal opacification of the posterior tibial and peroneal arteries at the level of the ankle. 4. There is a 6 mm pulmonary nodule in the right upper lobe. Non-contrast chest CT at 3-6 months is recommended. If the nodules are stable at time of repeat CT, then future CT at 18-24 months (from today's scan) is considered optional for low-risk patients, but is recommended for high-risk patients. This recommendation follows the consensus statement: Guidelines for Management of Incidental Pulmonary Nodules Detected on CT Images: From the Fleischner Society 2017; Radiology 2017; 284:228-243. 5. 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 6. There is diverticulosis without CT evidence for diverticulitis. 7. Aortic Atherosclerosis (ICD10-I70.0) and Emphysema (ICD10-J43.9). Electronically Signed   By: Constance Holster M.D.   On:  11/03/2019 21:35   VAS [place] ABI WITH/WO TBI  Result Date: 11/04/2019 LOWER EXTREMITY DOPPLER STUDY Indications: Peripheral artery disease, and Blue toe syndrome. High Risk Factors: Hypertension, Diabetes, current smoker.  Vascular Interventions:  Balloon angioplasty of left external iliac artery. Comparison Study: No prior study on file Performing Technologist: Sharion Dove RVS  Examination Guidelines: A complete evaluation includes at minimum, Doppler waveform signals and systolic blood pressure reading at the level of bilateral brachial, anterior tibial, and posterior tibial arteries, when vessel segments are accessible. Bilateral testing is considered an integral part of a complete examination. Photoelectric Plethysmograph (PPG) waveforms and toe systolic pressure readings are included as required and additional duplex testing as needed. Limited examinations for reoccurring indications may be performed as noted.  ABI Findings: +---------+------------------+-----+---------+--------+ Right    Rt Pressure (mmHg)IndexWaveform Comment  +---------+------------------+-----+---------+--------+ Brachial 170                    triphasic         +---------+------------------+-----+---------+--------+ PTA      179               1.03 biphasic          +---------+------------------+-----+---------+--------+ DP       166               0.96 biphasic          +---------+------------------+-----+---------+--------+ Great Toe112               0.65                   +---------+------------------+-----+---------+--------+ +---------+------------------+-----+---------+-------+ Left     Lt Pressure (mmHg)IndexWaveform Comment +---------+------------------+-----+---------+-------+ Brachial 173                    triphasic        +---------+------------------+-----+---------+-------+ ATA      131               0.76 biphasic         +---------+------------------+-----+---------+-------+ DP       118               0.68 biphasic         +---------+------------------+-----+---------+-------+ Great Toe50                0.29                  +---------+------------------+-----+---------+-------+  +-------+-----------+-----------+------------+------------+ ABI/TBIToday's ABIToday's TBIPrevious ABIPrevious TBI +-------+-----------+-----------+------------+------------+ Right  1.03       0.65                                +-------+-----------+-----------+------------+------------+ Left   0.68       0.29                                +-------+-----------+-----------+------------+------------+  Summary: Right: Resting right ankle-brachial index is within normal range. No evidence of significant right lower extremity arterial disease. The right toe-brachial index is abnormal. Left: Resting left ankle-brachial index indicates moderate left lower extremity arterial disease. The left toe-brachial index is abnormal.  *See table(s) above for measurements and observations.     Preliminary     Scheduled Meds: . aspirin EC  81 mg Oral Daily  . heparin  2,000 Units Intravenous Once  . insulin aspart  0-15 Units Subcutaneous TID  WC  . insulin aspart  0-5 Units Subcutaneous QHS  . losartan  25 mg Oral Daily  . nicotine  21 mg Transdermal Daily  . sodium chloride flush  3 mL Intravenous Once  . sodium chloride flush  3 mL Intravenous Q12H  . tamsulosin  0.4 mg Oral Daily   Continuous Infusions: . sodium chloride 250 mL (11/04/19 0116)  . heparin 1,400 Units/hr (11/04/19 1532)     LOS: 1 day   Marylu Lund, MD [facility] Pager On Amion  If 7PM-7AM, please contact night-coverage 11/05/2019, 4:37 PM

## 2019-11-05 NOTE — Progress Notes (Signed)
ANTICOAGULATION CONSULT NOTE - follow up consult  Pharmacy Consult for Heparin Indication: acute ischemic limb  Allergies  Allergen Reactions  . Pollen Extract Other (See Comments)    Sneezing, coughing    Patient Measurements: Height: 5\' 10"  (177.8 cm) Weight: 166 lb (75.3 kg) IBW/kg (Calculated) : 73 Heparin Dosing Weight: 75.3 kg  Vital Signs: Temp: 97.9 F (36.6 C) (03/01 0724) Temp Source: Oral (03/01 0724) BP: 145/62 (03/01 0724) Pulse Rate: 61 (03/01 0724)  Labs: Recent Labs    11/03/19 1804 11/03/19 1804 11/04/19 0239 11/04/19 0239 11/04/19 1220 11/04/19 1902 11/05/19 0136  HGB 14.6   < > 12.8*  --   --   --  13.1  HCT 43.8  --  37.4*  --   --   --  38.3*  PLT 253  --  180  --   --   --  190  APTT 28  --   --   --   --   --   --   LABPROT 12.2  --   --   --   --   --   --   INR 0.9  --   --   --   --   --   --   HEPARINUNFRC  --   --  <0.10*   < > 0.59 0.63 0.68  CREATININE 1.21  --   --   --   --   --   --    < > = values in this interval not displayed.    Estimated Creatinine Clearance: 65.4 mL/min (by C-G formula based on SCr of 1.21 mg/dL).   Medical History: Past Medical History:  Diagnosis Date  . Diabetes mellitus without complication ([place])   . Hypertension   . PAD (peripheral artery disease) ([facility])     Medications:  Scheduled:  . aspirin EC  81 mg Oral Daily  . heparin  2,000 Units Intravenous Once  . insulin aspart  0-15 Units Subcutaneous TID WC  . insulin aspart  0-5 Units Subcutaneous QHS  . losartan  25 mg Oral Daily  . nicotine  21 mg Transdermal Daily  . sodium chloride flush  3 mL Intravenous Once  . sodium chloride flush  3 mL Intravenous Q12H  . tamsulosin  0.4 mg Oral Daily    Assessment: 64 y.o. male with hx of PAD and R extremity claudication presenting with toe pain and found to have ischemic limb. Pharmacy has been consulted for IV heparin dosing.   Heparin level remains therapeutic  Goal of Therapy:  Heparin  level 0.3-0.7 units/ml Monitor platelets by anticoagulation protocol: Yes  Plan:  Continue heparin infusion at 1400 units/hr Monitor CBC, daily heparin level  Continue to monitor for signs/symptoms of bleeding F/u plans for invasive treatment   Thank you Anette Guarneri, PharmD Please check AMION for all [place] phone numbers After 10:00 PM, call [place]

## 2019-11-05 NOTE — Progress Notes (Signed)
Referring Physician(s): Dr. Tivis Ringer  Supervising Physician: Markus Daft  Patient Status:  [facility] - In-pt  Chief Complaint: Discoloration left great toe  Subjective: Patient resting comfortably in bed.  States he still has cramping "off and on" in his legs, improved with IV heparin. Agreeable to procedure tomorrow.  Smokes 2 PPD.  Smoker since the age of 26.   Allergies: Pollen extract  Medications: Prior to Admission medications   Medication Sig Start Date End Date Taking? Authorizing Provider  aspirin 325 MG tablet Take 162.5 mg by mouth daily.   Yes [provider]  B Complex-C (SUPER B COMPLEX PO) Take 1 tablet by mouth at bedtime.   Yes [provider]  Cholecalciferol (VITAMIN D-3) 125 MCG (5000 UT) TABS Take 5,000 Units by mouth daily.   Yes [provider]  Coenzyme Q10 (COQ-10) 100 MG CAPS Take 100 mg by mouth daily.   Yes [provider]  diphenhydrAMINE (BENADRYL) 25 MG tablet Take 25 mg by mouth daily as needed (congestion).   Yes [provider]  glipiZIDE (GLUCOTROL XL) 5 MG 24 hr tablet Take 5 mg by mouth daily as needed (with meals that will increase blood sugar levels).    Yes [provider]  losartan (COZAAR) 25 MG tablet Take 25 mg by mouth daily. 10/16/19  Yes [provider]  metFORMIN (GLUCOPHAGE-XR) 500 MG 24 hr tablet Take 500 mg by mouth 2 (two) times daily with a meal.  01/28/18  Yes [provider]  Multiple Vitamin (MULTIVITAMIN WITH MINERALS) TABS tablet Take 1 tablet by mouth once a week.   Yes [provider]  oxymetazoline (AFRIN) 0.05 % nasal spray Place 1 spray into both nostrils 2 (two) times daily as needed for congestion.   Yes [provider]  pravastatin (PRAVACHOL) 20 MG tablet Take 20 mg by mouth 3 (three) times a week.  10/16/19  Yes [provider]  tamsulosin (FLOMAX) 0.4 MG CAPS capsule Take 0.4 mg by mouth daily after lunch.  01/19/18  Yes  [provider]  vitamin E (VITAMIN E) 180 MG (400 UNITS) capsule Take 400 Units by mouth 4 (four) times a week.   Yes [provider]     Vital Signs: BP (!) 145/53 (BP Location: Right Arm)   Pulse (!) 57   Temp 98.1 F (36.7 C) (Oral)   Resp 18   Ht 5\' 10"  (1.778 m)   Wt 166 lb (75.3 kg)   SpO2 98%   BMI 23.82 kg/m   Physical Exam Vitals and nursing note reviewed.  Constitutional:      General: He is not in acute distress.    Appearance: He is not ill-appearing.  HENT:     Mouth/Throat:     Mouth: Mucous membranes are moist.     Pharynx: Oropharynx is clear.  Cardiovascular:     Rate and Rhythm: Normal rate and regular rhythm.     Pulses: Normal pulses.  Pulmonary:     Effort: Pulmonary effort is normal. No respiratory distress.     Breath sounds: Wheezing (faint expiratory, bilateral) present.  Musculoskeletal:        General: No swelling or tenderness. Normal range of motion.     Comments: Discoloration of the L great toe significantly improved, still present  Skin:    General: Skin is warm and dry.  Neurological:     General: No focal deficit present.     Mental Status: He is alert and  oriented to person, place, and time. Mental status is at baseline.  Psychiatric:        Mood and Affect: Mood normal.        Behavior: Behavior normal.        Thought Content: Thought content normal.        Judgment: Judgment normal.       Imaging: CT ANGIO AO+BIFEM W & OR WO CONTRAST  Result Date: 11/03/2019 CLINICAL DATA:  Great toe discoloration. EXAM: CT ANGIOGRAPHY OF ABDOMINAL AORTA WITH ILIOFEMORAL RUNOFF CT ANGIO CHEST TECHNIQUE: Multidetector CT imaging of the abdomen, pelvis and lower extremities was performed using the standard protocol during bolus administration of intravenous contrast. Multiplanar CT image reconstructions and MIPs were obtained to evaluate the vascular anatomy. Multidetector CT imaging of the chest, abdomen, pelvis and lower  extremities was performed using the standard protocol during bolus administration of intravenous contrast. Multiplanar CT image reconstructions and MIPs were obtained to evaluate the vascular anatomy. CONTRAST:  163mL OMNIPAQUE IOHEXOL 350 MG/ML SOLN COMPARISON:  None. CT angio dated March 06, 2018. FINDINGS: VASCULAR Aorta: There are atherosclerotic changes throughout the abdominal aorta without evidence for an aneurysm or dissection. Celiac: There is moderate narrowing at the origin of the celiac axis which may be secondary to atherosclerotic disease or median arcuate ligament syndrome there is some mild poststenotic dilatation. SMA: Patent without evidence of aneurysm, dissection, vasculitis or significant stenosis. Renals: There are 2 patent right renal arteries. There are 2 patent left renal arteries. IMA: Patent without evidence of aneurysm, dissection, vasculitis or significant stenosis. RIGHT Lower Extremity Inflow: There are atherosclerotic changes of the common iliac artery without evidence for high-grade stenosis. There is a less than 50% stenosis at the origin of the right external iliac artery. There is improved luminal diameter of the distal right external iliac artery when compared to prior study in 2019 consistent with a positive response to treatment. There may be an approximately 50% stenosis at this level. Outflow: There is mild narrowing of the right common femoral artery. There is mild atherosclerotic disease throughout the right profundus femoris and right superficial femoral arteries without evidence for a high-grade stenosis. The right popliteal artery is patent without evidence for high-grade stenosis. Runoff: The proximal anterior tibial, posterior tibial, and peroneal arteries are patent. There is suboptimal opacification of the right anterior tibial and peroneal arteries at the level of the ankle. The posterior tibial artery appears to be patent. This appearance is similar to study in 2019.  LEFT Lower Extremity Inflow: Atherosclerotic disease is noted involving the left common iliac artery. There is an approximately 70% stenosis in the mid to distal left common iliac artery (axial series 1, image 246). The left internal iliac artery is occluded. There is mild-to-moderate stenosis of the proximal left external iliac artery. There is a high-grade stenosis of the left external iliac artery (axial series 1, image 270). This is relatively similar to prior CT in 2019. The remaining portions of the left external iliac artery are widely patent. Outflow: There is mild narrowing of the distal left common femoral artery (axial series 1, image 311). The left profunda femoris and left SFA are patent with mild atherosclerotic disease throughout. The left popliteal artery is widely patent. Runoff: The left anterior tibial, posterior tibial, and peroneal arteries are patent proximally. There is suboptimal opacification of the left anterior tibial and peroneal arteries at the level of the ankle. The posterior tibial artery appears to be patent. There is a patent lateral  and medial plantar arch. Veins: No obvious venous abnormality within the limitations of this arterial phase study. Review of the MIP images confirms the above findings. NON-VASCULAR CT CHEST: Cardiovascular: There is no evidence for thoracic aortic aneurysm or dissection. Atherosclerotic changes are noted of the thoracic aorta. There is no evidence for an acute pulmonary embolism. The heart size is normal. The arch vessels are patent with some mild to moderate narrowing at the origin of the left common carotid artery. There are atherosclerotic changes of both common carotid arteries without evidence for high-grade stenosis. The right vertebral artery is dominant. Mediastinum/Nodes: --No mediastinal or hilar lymphadenopathy. --No axillary lymphadenopathy. --No supraclavicular lymphadenopathy. --Normal thyroid gland. --The esophagus is unremarkable  Lungs/Pleura: Emphysematous changes are noted bilaterally. There is a pulmonary nodule in the posterior right upper lobe measuring approximately 6 mm (axial series 1, image 38). There is no pneumothorax or large pleural effusion. Musculoskeletal: No chest wall abnormality. No acute or significant osseous findings. Review of the MIP images confirms the above findings. CT ABDOMEN PELVIS: Hepatobiliary: There is a cystic appearing lesion in the posterior right hepatic lobe, stable from prior study. Normal gallbladder.There is no biliary ductal dilation. Pancreas: Normal contours without ductal dilatation. No peripancreatic fluid collection. Spleen: No splenic laceration or hematoma. Adrenals/Urinary Tract: --Adrenal glands: No adrenal hemorrhage. --Right kidney/ureter: No hydronephrosis or perinephric hematoma. --Left kidney/ureter: No hydronephrosis or perinephric hematoma. --Urinary bladder: Unremarkable. Stomach/Bowel: --Stomach/Duodenum: There is a large duodenal diverticulum. --Small bowel: No dilatation or inflammation. --Colon: Rectosigmoid diverticulosis without acute inflammation. --Appendix: Normal. Lymphatic: --No retroperitoneal lymphadenopathy. --No mesenteric lymphadenopathy. --No pelvic or inguinal lymphadenopathy. Reproductive: Unremarkable Other: No ascites or free air. There are bilateral fat containing inguinal hernias, right greater than left. Musculoskeletal. No acute displaced fractures. IMPRESSION: 1. No evidence for an acute vascular abnormality. No evidence for dissection. No acute pulmonary embolism. No aortic aneurysm. No evidence for an acute arterial embolism. 2. There is a mild stenosis of the distal right external iliac artery, significantly improved from prior study in 2019. Atherosclerotic disease is noted throughout the right superficial femoral artery resulting in mild-to-moderate narrowing and no evidence for a high-grade stenosis. There is a similar single vessel runoff via the  posterior tibial artery on the right. There is questionable opacification of the peroneal and anterior tibial arteries at the level of the ankle. 3. Atherosclerotic changes are noted of the left common iliac artery resulting in approximately 70% stenosis. There is persistent moderate narrowing of the proximal left external iliac artery, slightly improved since 2019. Mild-to-moderate narrowing is noted of the left distal common femoral artery. Again noted is an essentially single vessel runoff of the left lower extremity via the posterior tibial artery. There is minimal opacification of the posterior tibial and peroneal arteries at the level of the ankle. 4. There is a 6 mm pulmonary nodule in the right upper lobe. Non-contrast chest CT at 3-6 months is recommended. If the nodules are stable at time of repeat CT, then future CT at 18-24 months (from today's scan) is considered optional for low-risk patients, but is recommended for high-risk patients. This recommendation follows the consensus statement: Guidelines for Management of Incidental Pulmonary Nodules Detected on CT Images: From the Fleischner Society 2017; Radiology 2017; 284:228-243. 5. 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 6. There is diverticulosis without CT evidence for diverticulitis. 7. Aortic Atherosclerosis (ICD10-I70.0) and Emphysema (ICD10-J43.9). Electronically Signed   By: Constance Holster M.D.   On: 11/03/2019  21:35   CT ANGIO CHEST AORTA W/CM &/OR WO/CM  Result Date: 11/03/2019 CLINICAL DATA:  Great toe discoloration. EXAM: CT ANGIOGRAPHY OF ABDOMINAL AORTA WITH ILIOFEMORAL RUNOFF CT ANGIO CHEST TECHNIQUE: Multidetector CT imaging of the abdomen, pelvis and lower extremities was performed using the standard protocol during bolus administration of intravenous contrast. Multiplanar CT image reconstructions and MIPs were obtained to evaluate the vascular anatomy. Multidetector CT imaging of the chest,  abdomen, pelvis and lower extremities was performed using the standard protocol during bolus administration of intravenous contrast. Multiplanar CT image reconstructions and MIPs were obtained to evaluate the vascular anatomy. CONTRAST:  16mL OMNIPAQUE IOHEXOL 350 MG/ML SOLN COMPARISON:  None. CT angio dated March 06, 2018. FINDINGS: VASCULAR Aorta: There are atherosclerotic changes throughout the abdominal aorta without evidence for an aneurysm or dissection. Celiac: There is moderate narrowing at the origin of the celiac axis which may be secondary to atherosclerotic disease or median arcuate ligament syndrome there is some mild poststenotic dilatation. SMA: Patent without evidence of aneurysm, dissection, vasculitis or significant stenosis. Renals: There are 2 patent right renal arteries. There are 2 patent left renal arteries. IMA: Patent without evidence of aneurysm, dissection, vasculitis or significant stenosis. RIGHT Lower Extremity Inflow: There are atherosclerotic changes of the common iliac artery without evidence for high-grade stenosis. There is a less than 50% stenosis at the origin of the right external iliac artery. There is improved luminal diameter of the distal right external iliac artery when compared to prior study in 2019 consistent with a positive response to treatment. There may be an approximately 50% stenosis at this level. Outflow: There is mild narrowing of the right common femoral artery. There is mild atherosclerotic disease throughout the right profundus femoris and right superficial femoral arteries without evidence for a high-grade stenosis. The right popliteal artery is patent without evidence for high-grade stenosis. Runoff: The proximal anterior tibial, posterior tibial, and peroneal arteries are patent. There is suboptimal opacification of the right anterior tibial and peroneal arteries at the level of the ankle. The posterior tibial artery appears to be patent. This appearance is  similar to study in 2019. LEFT Lower Extremity Inflow: Atherosclerotic disease is noted involving the left common iliac artery. There is an approximately 70% stenosis in the mid to distal left common iliac artery (axial series 1, image 246). The left internal iliac artery is occluded. There is mild-to-moderate stenosis of the proximal left external iliac artery. There is a high-grade stenosis of the left external iliac artery (axial series 1, image 270). This is relatively similar to prior CT in 2019. The remaining portions of the left external iliac artery are widely patent. Outflow: There is mild narrowing of the distal left common femoral artery (axial series 1, image 311). The left profunda femoris and left SFA are patent with mild atherosclerotic disease throughout. The left popliteal artery is widely patent. Runoff: The left anterior tibial, posterior tibial, and peroneal arteries are patent proximally. There is suboptimal opacification of the left anterior tibial and peroneal arteries at the level of the ankle. The posterior tibial artery appears to be patent. There is a patent lateral and medial plantar arch. Veins: No obvious venous abnormality within the limitations of this arterial phase study. Review of the MIP images confirms the above findings. NON-VASCULAR CT CHEST: Cardiovascular: There is no evidence for thoracic aortic aneurysm or dissection. Atherosclerotic changes are noted of the thoracic aorta. There is no evidence for an acute pulmonary embolism. The heart size is normal.  The arch vessels are patent with some mild to moderate narrowing at the origin of the left common carotid artery. There are atherosclerotic changes of both common carotid arteries without evidence for high-grade stenosis. The right vertebral artery is dominant. Mediastinum/Nodes: --No mediastinal or hilar lymphadenopathy. --No axillary lymphadenopathy. --No supraclavicular lymphadenopathy. --Normal thyroid gland. --The  esophagus is unremarkable Lungs/Pleura: Emphysematous changes are noted bilaterally. There is a pulmonary nodule in the posterior right upper lobe measuring approximately 6 mm (axial series 1, image 38). There is no pneumothorax or large pleural effusion. Musculoskeletal: No chest wall abnormality. No acute or significant osseous findings. Review of the MIP images confirms the above findings. CT ABDOMEN PELVIS: Hepatobiliary: There is a cystic appearing lesion in the posterior right hepatic lobe, stable from prior study. Normal gallbladder.There is no biliary ductal dilation. Pancreas: Normal contours without ductal dilatation. No peripancreatic fluid collection. Spleen: No splenic laceration or hematoma. Adrenals/Urinary Tract: --Adrenal glands: No adrenal hemorrhage. --Right kidney/ureter: No hydronephrosis or perinephric hematoma. --Left kidney/ureter: No hydronephrosis or perinephric hematoma. --Urinary bladder: Unremarkable. Stomach/Bowel: --Stomach/Duodenum: There is a large duodenal diverticulum. --Small bowel: No dilatation or inflammation. --Colon: Rectosigmoid diverticulosis without acute inflammation. --Appendix: Normal. Lymphatic: --No retroperitoneal lymphadenopathy. --No mesenteric lymphadenopathy. --No pelvic or inguinal lymphadenopathy. Reproductive: Unremarkable Other: No ascites or free air. There are bilateral fat containing inguinal hernias, right greater than left. Musculoskeletal. No acute displaced fractures. IMPRESSION: 1. No evidence for an acute vascular abnormality. No evidence for dissection. No acute pulmonary embolism. No aortic aneurysm. No evidence for an acute arterial embolism. 2. There is a mild stenosis of the distal right external iliac artery, significantly improved from prior study in 2019. Atherosclerotic disease is noted throughout the right superficial femoral artery resulting in mild-to-moderate narrowing and no evidence for a high-grade stenosis. There is a similar single  vessel runoff via the posterior tibial artery on the right. There is questionable opacification of the peroneal and anterior tibial arteries at the level of the ankle. 3. Atherosclerotic changes are noted of the left common iliac artery resulting in approximately 70% stenosis. There is persistent moderate narrowing of the proximal left external iliac artery, slightly improved since 2019. Mild-to-moderate narrowing is noted of the left distal common femoral artery. Again noted is an essentially single vessel runoff of the left lower extremity via the posterior tibial artery. There is minimal opacification of the posterior tibial and peroneal arteries at the level of the ankle. 4. There is a 6 mm pulmonary nodule in the right upper lobe. Non-contrast chest CT at 3-6 months is recommended. If the nodules are stable at time of repeat CT, then future CT at 18-24 months (from today's scan) is considered optional for low-risk patients, but is recommended for high-risk patients. This recommendation follows the consensus statement: Guidelines for Management of Incidental Pulmonary Nodules Detected on CT Images: From the Fleischner Society 2017; Radiology 2017; 284:228-243. 5. 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 6. There is diverticulosis without CT evidence for diverticulitis. 7. Aortic Atherosclerosis (ICD10-I70.0) and Emphysema (ICD10-J43.9). Electronically Signed   By: Constance Holster M.D.   On: 11/03/2019 21:35   VAS [place] ABI WITH/WO TBI  Result Date: 11/04/2019 LOWER EXTREMITY DOPPLER STUDY Indications: Peripheral artery disease, and Blue toe syndrome. High Risk Factors: Hypertension, Diabetes, current smoker.  Vascular Interventions: Balloon angioplasty of left external iliac artery. Comparison Study: No prior study on file Performing Technologist: Sharion Dove RVS  Examination Guidelines: A complete evaluation includes at minimum,  Doppler waveform signals and systolic  blood pressure reading at the level of bilateral brachial, anterior tibial, and posterior tibial arteries, when vessel segments are accessible. Bilateral testing is considered an integral part of a complete examination. Photoelectric Plethysmograph (PPG) waveforms and toe systolic pressure readings are included as required and additional duplex testing as needed. Limited examinations for reoccurring indications may be performed as noted.  ABI Findings: +---------+------------------+-----+---------+--------+ Right    Rt Pressure (mmHg)IndexWaveform Comment  +---------+------------------+-----+---------+--------+ Brachial 170                    triphasic         +---------+------------------+-----+---------+--------+ PTA      179               1.03 biphasic          +---------+------------------+-----+---------+--------+ DP       166               0.96 biphasic          +---------+------------------+-----+---------+--------+ Great Toe112               0.65                   +---------+------------------+-----+---------+--------+ +---------+------------------+-----+---------+-------+ Left     Lt Pressure (mmHg)IndexWaveform Comment +---------+------------------+-----+---------+-------+ Brachial 173                    triphasic        +---------+------------------+-----+---------+-------+ ATA      131               0.76 biphasic         +---------+------------------+-----+---------+-------+ DP       118               0.68 biphasic         +---------+------------------+-----+---------+-------+ Great Toe50                0.29                  +---------+------------------+-----+---------+-------+ +-------+-----------+-----------+------------+------------+ ABI/TBIToday's ABIToday's TBIPrevious ABIPrevious TBI +-------+-----------+-----------+------------+------------+ Right  1.03       0.65                                 +-------+-----------+-----------+------------+------------+ Left   0.68       0.29                                +-------+-----------+-----------+------------+------------+  Summary: Right: Resting right ankle-brachial index is within normal range. No evidence of significant right lower extremity arterial disease. The right toe-brachial index is abnormal. Left: Resting left ankle-brachial index indicates moderate left lower extremity arterial disease. The left toe-brachial index is abnormal.  *See table(s) above for measurements and observations.     Preliminary     Labs:  CBC: Recent Labs    11/03/19 1804 11/04/19 0239 11/05/19 0136  WBC 11.0* 9.7 9.8  HGB 14.6 12.8* 13.1  HCT 43.8 37.4* 38.3*  PLT 253 180 190    COAGS: Recent Labs    11/03/19 1804  INR 0.9  APTT 28    BMP: Recent Labs    11/03/19 1804  NA 141  K 4.7  CL 109  CO2 24  GLUCOSE 107*  BUN 15  CALCIUM 9.9  CREATININE 1.21  GFRNONAA >60  GFRAA >60    LIVER FUNCTION TESTS: Recent Labs    11/03/19 1804  BILITOT 0.8  AST 20  ALT 27  ALKPHOS 67  PROT 6.7  ALBUMIN 4.2    Assessment and Plan: Discoloration of the L great toe Patient resting comfortably in bed.  Improved on IV heparin, but still with cramping.  Reports he smokes 2PPD recently (has been able to get down to 1PPD in the recent past).  WBC. 9.8 SCr 1.21  ABI performed yesterday: Right: Resting right ankle-brachial index is within normal range. No  evidence of significant right lower extremity arterial disease. The right  toe-brachial index is abnormal.   Left: Resting left ankle-brachial index indicates moderate left lower  extremity arterial disease. The left toe-brachial index is abnormal.   Discussed with Dr. Earleen Newport.  Plan for intervention tomorrow in IR with sedation.  NPO p MN.  Hold heparin at MN.   Risks and benefits of cerebral angiogram with intervention were discussed with the patient including, but not  limited to bleeding, infection, vascular injury, contrast induced renal failure, stroke or even death.  This interventional procedure involves the use of X-rays and because of the nature of the planned procedure, it is possible that we will have prolonged use of X-ray fluoroscopy.  Potential radiation risks to you include (but are not limited to) the following: - A slightly elevated risk for cancer  several years later in life. This risk is typically less than 0.5% percent. This risk is low in comparison to the normal incidence of human cancer, which is 33% for women and 50% for men according to the [place]. - Radiation induced injury can include skin redness, resembling a rash, tissue breakdown / ulcers and hair loss (which can be temporary or permanent).   The likelihood of either of these occurring depends on the difficulty of the procedure and whether you are sensitive to radiation due to previous procedures, disease, or genetic conditions.   IF your procedure requires a prolonged use of radiation, you will be notified and given written instructions for further action.  It is your responsibility to monitor the irradiated area for the 2 weeks following the procedure and to notify your physician if you are concerned that you have suffered a radiation induced injury.    All of the patient's questions were answered, patient is agreeable to proceed.  Consent signed and in chart.  Electronically Signed: Docia Barrier, PA 11/05/2019, 2:57 PM   I spent a total of 15 Minutes at the the patient's bedside AND on the patient's hospital floor or unit, greater than 50% of which was counseling/coordinating care for discoloration of left great toe, clue toe syndrome.

## 2019-11-05 NOTE — Plan of Care (Signed)
  Problem: Pain Managment: Goal: General experience of comfort will improve Outcome: Progressing   Problem: Safety: Goal: Ability to remain free from injury will improve Outcome: Progressing   Problem: Skin Integrity: Goal: Risk for impaired skin integrity will decrease Outcome: Progressing   

## 2019-11-06 ENCOUNTER — Inpatient Hospital Stay (HOSPITAL_COMMUNITY): Payer: BC Managed Care – PPO

## 2019-11-06 ENCOUNTER — Encounter (HOSPITAL_COMMUNITY): Payer: Self-pay

## 2019-11-06 DIAGNOSIS — E1151 Type 2 diabetes mellitus with diabetic peripheral angiopathy without gangrene: Secondary | ICD-10-CM | POA: Diagnosis not present

## 2019-11-06 DIAGNOSIS — I745 Embolism and thrombosis of iliac artery: Secondary | ICD-10-CM | POA: Diagnosis not present

## 2019-11-06 DIAGNOSIS — I743 Embolism and thrombosis of arteries of the lower extremities: Secondary | ICD-10-CM | POA: Diagnosis not present

## 2019-11-06 DIAGNOSIS — I10 Essential (primary) hypertension: Secondary | ICD-10-CM | POA: Diagnosis not present

## 2019-11-06 DIAGNOSIS — R230 Cyanosis: Secondary | ICD-10-CM | POA: Diagnosis not present

## 2019-11-06 DIAGNOSIS — I75022 Atheroembolism of left lower extremity: Secondary | ICD-10-CM | POA: Diagnosis not present

## 2019-11-06 HISTORY — PX: IR US GUIDE VASC ACCESS RIGHT: IMG2390

## 2019-11-06 HISTORY — PX: IR ILIAC ART STENT INC PTA MOD SED: IMG2306

## 2019-11-06 HISTORY — PX: IR ANGIOGRAM PELVIS SELECTIVE OR SUPRASELECTIVE: IMG661

## 2019-11-06 LAB — CBC
HCT: 39.4 % (ref 39.0–52.0)
Hemoglobin: 13.2 g/dL (ref 13.0–17.0)
MCH: 31.7 pg (ref 26.0–34.0)
MCHC: 33.5 g/dL (ref 30.0–36.0)
MCV: 94.5 fL (ref 80.0–100.0)
Platelets: 189 10*3/uL (ref 150–400)
RBC: 4.17 MIL/uL — ABNORMAL LOW (ref 4.22–5.81)
RDW: 11.9 % (ref 11.5–15.5)
WBC: 8.6 10*3/uL (ref 4.0–10.5)
nRBC: 0 % (ref 0.0–0.2)

## 2019-11-06 LAB — HEPARIN LEVEL (UNFRACTIONATED): Heparin Unfractionated: <0.1 IU/mL — ABNORMAL LOW (ref 0.30–0.70)

## 2019-11-06 LAB — GLUCOSE, CAPILLARY: Glucose-Capillary: 119 mg/dL — ABNORMAL HIGH (ref 70–99)

## 2019-11-06 MED ORDER — CLOPIDOGREL BISULFATE 300 MG PO TABS
ORAL_TABLET | ORAL | Status: AC
  Filled 2019-11-06: qty 1

## 2019-11-06 MED ORDER — CLOPIDOGREL BISULFATE 75 MG PO TABS: 75.0000 mg | ORAL_TABLET | Freq: Every day | ORAL | 0 refills | Status: AC

## 2019-11-06 MED ORDER — FENTANYL CITRATE (PF) 100 MCG/2ML IJ SOLN
INTRAMUSCULAR | Status: AC
  Filled 2019-11-06: qty 2

## 2019-11-06 MED ORDER — IOHEXOL 300 MG/ML  SOLN
100.0000 mL | Freq: Once | INTRAMUSCULAR | Status: AC | PRN
  Administered 2019-11-06: 32 mL

## 2019-11-06 MED ORDER — CLOPIDOGREL BISULFATE 75 MG PO TABS
300.0000 mg | ORAL_TABLET | Freq: Once | ORAL | Status: AC
  Administered 2019-11-06: 300 mg via ORAL

## 2019-11-06 MED ORDER — LIDOCAINE HCL 1 % IJ SOLN
INTRAMUSCULAR | Status: AC
  Filled 2019-11-06: qty 20

## 2019-11-06 MED ORDER — CLOPIDOGREL BISULFATE 75 MG PO TABS: 75.0000 mg | ORAL_TABLET | Freq: Every day | ORAL | Status: DC

## 2019-11-06 MED ORDER — HEPARIN SODIUM (PORCINE) 1000 UNIT/ML IJ SOLN
INTRAMUSCULAR | Status: AC
  Filled 2019-11-06: qty 1

## 2019-11-06 MED ORDER — MIDAZOLAM HCL 2 MG/2ML IJ SOLN
INTRAMUSCULAR | Status: AC
  Filled 2019-11-06: qty 2

## 2019-11-06 MED ORDER — ASPIRIN 325 MG PO TABS: 650.0000 mg | ORAL_TABLET | Freq: Once | ORAL | Status: AC

## 2019-11-06 MED ORDER — ASPIRIN 325 MG PO TABS
ORAL_TABLET | ORAL | Status: AC
  Administered 2019-11-06: 650 mg via ORAL
  Filled 2019-11-06: qty 2

## 2019-11-06 MED ORDER — ASPIRIN 81 MG PO TBEC: 81.0000 mg | DELAYED_RELEASE_TABLET | Freq: Every day | ORAL | 0 refills | Status: AC

## 2019-11-06 MED ORDER — LIDOCAINE HCL 1 % IJ SOLN
INTRAMUSCULAR | Status: AC | PRN
  Administered 2019-11-06: 10 mL

## 2019-11-06 MED ORDER — MIDAZOLAM HCL 2 MG/2ML IJ SOLN
INTRAMUSCULAR | Status: AC | PRN
  Administered 2019-11-06: 1 mg via INTRAVENOUS

## 2019-11-06 MED ORDER — IOHEXOL 300 MG/ML  SOLN
100.0000 mL | Freq: Once | INTRAMUSCULAR | Status: AC | PRN
  Administered 2019-11-06: 48 mL

## 2019-11-06 MED ORDER — FENTANYL CITRATE (PF) 100 MCG/2ML IJ SOLN
INTRAMUSCULAR | Status: AC | PRN
  Administered 2019-11-06: 50 ug via INTRAVENOUS

## 2019-11-06 NOTE — Discharge Instructions (Signed)
Follow up with Dr. Earleen Newport in 4-6 weeks with repeat ABI/non-invasive exam at [facility]  Take Plavix and Aspirin together for 90 days (3 months), afterwards continue with aspirin alone daily

## 2019-11-06 NOTE — Progress Notes (Signed)
Pt given discharge instructions and gone over with him and wife. Both verbalized understanding of instructions. They gathered pt belongings to go home. Pt transported home via private vehicle.

## 2019-11-06 NOTE — Evaluation (Signed)
Physical Therapy Evaluation Patient Details Name: William Shields MRN: [id number] DOB: 10/18/1955 Today's Date: 11/06/2019   History of Present Illness  Pt is 64 yo male with hx of R LE claudication, HTN, DM, and smoking.  Pt previously had angioplasty of L external iliac artery.  He was admitted with Blue toe syndrome and is  s/p US guided right CFA access, pelvic angiogram, treatment of left CIA and EIA tandem stenosis on 11/06/19. Pt was on 4 hr bedrest that is now complete.  Clinical Impression  Pt admitted with above diagnosis.  He was able to ambulate 300' and transfer safely and without difficulty.  Pt feels he is at his baseline and that his toe feels significantly better.  No further PT indicated.     Follow Up Recommendations No PT follow up    Equipment Recommendations  None recommended by PT    Recommendations for Other Services       Precautions / Restrictions Precautions Precautions: None      Mobility  Bed Mobility Overal bed mobility: Independent                Transfers Overall transfer level: Needs assistance Equipment used: None Transfers: Sit to/from Stand;Stand Pivot Transfers Sit to Stand: Supervision Stand pivot transfers: Supervision       General transfer comment: no difficulties  Ambulation/Gait Ambulation/Gait assistance: Supervision Gait Distance (Feet): 300 Feet Assistive device: None Gait Pattern/deviations: WFL(Within Functional Limits)     General Gait Details: normal gait and speed; able to look up/down/L/R ,stop, turn, and step over object without LOB  Stairs            Wheelchair Mobility    Modified Rankin (Stroke Patients Only)       Balance Overall balance assessment: Independent                                           Pertinent Vitals/Pain Pain Assessment: No/denies pain    Home Living Family/patient expects to be discharged to:: Private residence Living Arrangements: Spouse/significant  other Available Help at Discharge: Family;Available PRN/intermittently Type of Home: House Home Access: Stairs to enter Entrance Stairs-Rails: Right;Left;Can reach both Entrance Stairs-Number of Steps: 3 Home Layout: One level Home Equipment: None      Prior Function Level of Independence: Independent         Comments: Works as Management consultant Extremity Assessment Upper Extremity Assessment: Overall WFL for tasks assessed    Lower Extremity Assessment Lower Extremity Assessment: Overall WFL for tasks assessed    Cervical / Trunk Assessment Cervical / Trunk Assessment: Normal  Communication   Communication: No difficulties  Cognition Arousal/Alertness: Awake/alert Behavior During Therapy: WFL for tasks assessed/performed Overall Cognitive Status: Within Functional Limits for tasks assessed                                 General Comments: Reports feels much better and ready to go home      General Comments General comments (skin integrity, edema, etc.): Reports feels at baseline mobility.  Monitored procedure site (R groin) after each transfer and with no bleeding    Exercises     Assessment/Plan    PT Assessment Patent does not need  any further PT services  PT Problem List         PT Treatment Interventions      PT Goals (Current goals can be found in the Care Plan section)  Acute Rehab PT Goals Patient Stated Goal: return home today PT Goal Formulation: With patient/family Time For Goal Achievement: 11/06/19 Potential to Achieve Goals: Good    Frequency     Barriers to discharge        Co-evaluation               AM-PAC PT "6 Clicks" Mobility  Outcome Measure Help needed turning from your back to your side while in a flat bed without using bedrails?: None Help needed moving from lying on your back to sitting on the side of a flat bed without using bedrails?:  None Help needed moving to and from a bed to a chair (including a wheelchair)?: None Help needed standing up from a chair using your arms (e.g., wheelchair or bedside chair)?: None Help needed to walk in hospital room?: None Help needed climbing 3-5 steps with a railing? : None 6 Click Score: 24    End of Session Equipment Utilized During Treatment: Gait belt Activity Tolerance: Patient tolerated treatment well Patient left: in bed;with family/visitor present Nurse Communication: Mobility status(safe toambulate independently)      Time: [id number] PT Time Calculation (min) (ACUTE ONLY): 18 min   Charges:   PT Evaluation $PT Eval Low Complexity: 1 Low          Maggie Font, PT Acute Rehab Services Pager [phone] [facility] [phone] [facility] [place] 11/06/2019, 4:29 PM

## 2019-11-06 NOTE — Progress Notes (Signed)
Interventional Radiology Progress Note   64 yo male with left blue toe syndrome.   Improving discoloration and pain.    Warm extremities, + pulses.    Motor/sensory intact.   Plan today for angio, possible intervention.      Signed,  Dulcy Fanny. Earleen Newport, DO

## 2019-11-06 NOTE — Progress Notes (Signed)
IV heparin infusion discontinued for scheduled angiogram in AM. RN will continue to monitor patient.

## 2019-11-06 NOTE — Discharge Summary (Signed)
Physician Discharge Summary  William Shields UG:[id number] DOB: 30-Apr-1956 DOA: 11/03/2019  PCP: Jeanie Sewer, NP  Admit date: 11/03/2019 Discharge date: 11/06/2019  Admitted From: Home Disposition:  Home  Recommendations for Outpatient Follow-up:  1. Follow up with PCP in 1-2 weeks 2. Follow up with Interventional Radiology in 4-6 weeks with repeat ABI/noninvasive exam at [facility] 3. Take Aspirin and Plavix together for 90 days, then ASA alone afterwards  Discharge Condition:Stable CODE STATUS:Full Diet recommendation: Heart healthy   Brief/Interim Summary: 64 y.o.malewith medical history significant ofof right lower extremity claudication with risk factors hypertension, diabetes and current everyday smoking. Previously underwentballoon angioplasty of a high-grade stenosis left external iliac artery, balloon angioplasty of a critical stenosis of right external iliac artery andtreatment of right SFA disease with directional atherectomy and dcb pta.There was thought to be a component of thromboembolic disease at that time. He does take aspirin but apparently has discontinued statin therapy. States that for the last 1 to 2 days he is now had discoloration of the left great toe. Denies trauma.This is significantly painful to him. His sister who has recently undergone embolectomy for acute limb ischemia saw him and recommended him come to the emergency department. Of note all of patient's previous procedures were performed by Dr. Earleen Newport with interventional radiology  ED Course:Hemodynamically stable. Vascular surgerry consult obtained. CTA chest and peripheral ordered and done. IR consult obtained. Pharmacy consult for heparinization requested. Per recommendation Vasc Surg - [facility] requested to admit patient for Heparinization and to complete evaluation for blue toe syndrome   Discharge Diagnoses:  Active Problems:   Blue toe syndrome of left lower extremity  (HCC)   Essential hypertension   Secondary DM without complications (HCC)   PAD (peripheral artery disease) (HCC)   Tobacco abuse disorder   Blue toe syndrome of right lower extremity ([place])  1. Blue toe syndrome left  - pt with known PAD s/p several IR vascular procedures presents with discolored, cool and painful left great toe. Exam confirms poor circulation. CTA reveals PAD PlanIR consulted and had been following Pt s/p ABI's this AM with finding of mod LLE arterial disease Pt now s/p US guided R CFA access, pelvic angiogram on 3/2             Discussed with IR. OK to d/c home 4hrs after procedure if stable with  close outpt follow up             Per IR, recommendation for plavix and ASA x 90 days, then ASA alone  afterwards. IR to follow up in clinic in 4-6 weeks  2. HTN- adequate control PlanConsider changing from ARB to CCB at time of discharge for better effect smallvessel dilitation/circulation -BP remains stable at this time  3. DM - on metformin PlanA1C noted to be 7.1 Metformin on hold while in hospital Resume home meds on d/c  4. Tobacco abuse - Cessation done at time of presentation. PlanNicotine patch while pt is in-patient   Discharge Instructions   Allergies as of 11/06/2019      Reactions   Pollen Extract Other (See Comments)   Sneezing, coughing      Medication List    STOP taking these medications   aspirin 325 MG tablet   aspirin 81 MG tablet Replaced by: aspirin 81 MG EC tablet   pravastatin 20 MG tablet Commonly known as: PRAVACHOL     TAKE these medications   aspirin 81 MG EC tablet Take 1 tablet (81 mg total)  by mouth daily. Start taking on: November 07, 2019 Replaces: aspirin 81 MG tablet   clopidogrel 75 MG tablet Commonly known as: PLAVIX Take 1 tablet (75 mg total) by mouth daily. Start taking on: November 07, 2019   CoQ-10 100 MG Caps Take 100 mg by mouth daily.    diphenhydrAMINE 25 MG tablet Commonly known as: BENADRYL Take 25 mg by mouth daily as needed (congestion).   glipiZIDE 5 MG 24 hr tablet Commonly known as: GLUCOTROL XL Take 5 mg by mouth daily as needed (with meals that will increase blood sugar levels).   losartan 25 MG tablet Commonly known as: COZAAR Take 25 mg by mouth daily.   metFORMIN 500 MG 24 hr tablet Commonly known as: GLUCOPHAGE-XR Take 500 mg by mouth 2 (two) times daily with a meal.   multivitamin with minerals Tabs tablet Take 1 tablet by mouth once a week.   oxymetazoline 0.05 % nasal spray Commonly known as: AFRIN Place 1 spray into both nostrils 2 (two) times daily as needed for congestion.   SUPER B COMPLEX PO Take 1 tablet by mouth at bedtime.   tamsulosin 0.4 MG Caps capsule Commonly known as: FLOMAX Take 0.4 mg by mouth daily after lunch.   Vitamin D-3 125 MCG (5000 UT) Tabs Take 5,000 Units by mouth daily.   vitamin E 180 MG (400 UNITS) capsule Generic drug: vitamin E Take 400 Units by mouth 4 (four) times a week.      Follow-up Information    Jeanie Sewer, NP. Schedule an appointment as soon as possible for a visit in 2 week(s).   Specialty: Family Medicine Contact information: [place] [address] [phone]        Corrie Mckusick, DO Follow up in 4 week(s).   Specialties: Interventional Radiology, Radiology Contact information: [address] [place] [address] [phone]          Allergies  Allergen Reactions  . Pollen Extract Other (See Comments)    Sneezing, coughing    Consultations:  IR  Procedures/Studies: IR Angiogram Pelvis Selective Or Supraselective  Result Date: 11/06/2019 INDICATION: 64 year old male with a history of left-sided blue toe syndrome, presents for treatment of permissive lesion of the left iliac artery Right ABI: 1.03 Left ABI: 0.76 EXAM: ULTRASOUND GUIDED ACCESS RIGHT COMMON FEMORAL ARTERY PELVIC  ANGIOGRAM TREATMENT OF LEFT ILIAC ARTERY TANDEM STENOSIS WITH BALLOON ANGIOPLASTY AND BARE METAL STENTING ANGIO-SEAL FOR CLOSURE MEDICATIONS: None ANESTHESIA/SEDATION: Moderate (conscious) sedation was employed during this procedure. A total of Versed 1.0 mg and Fentanyl 50 mcg was administered intravenously. Moderate Sedation Time: 55 minutes. The patient's level of consciousness and vital signs were monitored continuously by radiology nursing throughout the procedure under my direct supervision. CONTRAST:  80 cc IV contrast FLUOROSCOPY TIME:  Fluoroscopy Time: 10 minutes 54 seconds (180 mGy). COMPLICATIONS: None PROCEDURE: Informed consent was obtained from the patient following explanation of the procedure, risks, benefits and alternatives. The patient understands, agrees and consents for the procedure. All questions were addressed. A time out was performed prior to the initiation of the procedure. Maximal barrier sterile technique utilized including caps, mask, sterile gowns, sterile gloves, large sterile drape, hand hygiene, and Betadine prep. Ultrasound survey of the right inguinal region was performed with images stored and sent to PACs, confirming patency of the vessel. 1% lidocaine was used for local anesthesia. Small stab incision was made. Blunt dissection was performed under ultrasound guidance to the common femoral artery.  A micropuncture needle was used access the right common femoral artery under ultrasound. With excellent arterial blood flow returned, and an .018 micro wire was passed through the needle, observed enter the abdominal aorta under fluoroscopy. The needle was removed, and a micropuncture sheath was placed over the wire. The inner dilator and wire were removed, and an 035 Bentson wire was advanced under fluoroscopy into the abdominal aorta. The sheath was removed and a standard 5 [place] vascular sheath was placed. The dilator was removed and the sheath was flushed. Omni Flush catheter was  passed on the Bentson wire to the pelvic aorta. Angiogram was performed. Bentson wire was then used to navigate the Omni Flush catheter over the bifurcation. Bentson wire was removed and a roadrunner wire was then used to navigate to the distal external iliac artery. Omni Flush catheter was passed to the distal external iliac artery and a rose in wire was placed. Six French to room 0 55 cm sheath was placed on the Rose an wire to the distal external iliac artery. Wire was removed and then the Bentson wire and Kumpe the catheter were used to navigate the wire into the proximal SFA. With a proximal position of the SFA, the Kumpe the catheter was then used to deliver a rose in wire into the SFA. The sheath was then withdrawn to the common iliac artery and a target angiogram was performed of the left iliac system. Measurements were made. We elected to place an initial 8 mm x 80 mm bare metal stent which was deployed distally. We then deployed in overlapping 9 mm by 30 mm stent in the common iliac artery. Balloon angioplasty was then performed with 8 mm x 40 mm standard balloon angioplasty. Repeat angiogram was performed. Angio-Seal was then deployed for hemostasis. Sterile bandage was placed. Patient tolerated the procedure well and remained hemodynamically stable throughout. No complications were encountered and no significant blood loss. FINDINGS: Pelvic angiogram demonstrates minimal atherosclerosis of the distal aorta with patency of the median sacral artery. No significant stenosis of the right iliac system with patency of the hypogastric artery maintained. Mild atherosclerosis of the left gastric artery with patency of the gluteal artery. No significant stenosis of the right common femoral artery. Greater than 80% stenosis of tandem lesions of the left common iliac artery with irregular plaque, as well as the mid external iliac artery with irregular plaque. Attenuated flow through the left hypogastric artery,  though remains patent with moderate to advanced atherosclerosis. The gluteal arteries remain patent on the left, before and after treatment. Status post treatment with balloon angioplasty and stenting there is 0% residual stenosis of the left iliac system. IMPRESSION: Ultrasound-guided access right common femoral artery for pelvic angiogram and treatment of tandem stenoses of the left iliac system with bare metal stenting and balloon angioplasty, with no residual stenosis. Angio-Seal for hemostasis. Signed, Dulcy Fanny. Dellia Nims, RPVI [facility] Radiology Electronically Signed   By: Corrie Mckusick D.O.   On: 11/06/2019 12:14   CT ANGIO AO+BIFEM W & OR WO CONTRAST  Result Date: 11/03/2019 CLINICAL DATA:  Great toe discoloration. EXAM: CT ANGIOGRAPHY OF ABDOMINAL AORTA WITH ILIOFEMORAL RUNOFF CT ANGIO CHEST TECHNIQUE: Multidetector CT imaging of the abdomen, pelvis and lower extremities was performed using the standard protocol during bolus administration of intravenous contrast. Multiplanar CT image reconstructions and MIPs were obtained to evaluate the vascular anatomy. Multidetector CT imaging of the chest, abdomen, pelvis and lower extremities was performed using  the standard protocol during bolus administration of intravenous contrast. Multiplanar CT image reconstructions and MIPs were obtained to evaluate the vascular anatomy. CONTRAST:  175mL OMNIPAQUE IOHEXOL 350 MG/ML SOLN COMPARISON:  None. CT angio dated March 06, 2018. FINDINGS: VASCULAR Aorta: There are atherosclerotic changes throughout the abdominal aorta without evidence for an aneurysm or dissection. Celiac: There is moderate narrowing at the origin of the celiac axis which may be secondary to atherosclerotic disease or median arcuate ligament syndrome there is some mild poststenotic dilatation. SMA: Patent without evidence of aneurysm, dissection, vasculitis or significant stenosis. Renals: There are 2  patent right renal arteries. There are 2 patent left renal arteries. IMA: Patent without evidence of aneurysm, dissection, vasculitis or significant stenosis. RIGHT Lower Extremity Inflow: There are atherosclerotic changes of the common iliac artery without evidence for high-grade stenosis. There is a less than 50% stenosis at the origin of the right external iliac artery. There is improved luminal diameter of the distal right external iliac artery when compared to prior study in 2019 consistent with a positive response to treatment. There may be an approximately 50% stenosis at this level. Outflow: There is mild narrowing of the right common femoral artery. There is mild atherosclerotic disease throughout the right profundus femoris and right superficial femoral arteries without evidence for a high-grade stenosis. The right popliteal artery is patent without evidence for high-grade stenosis. Runoff: The proximal anterior tibial, posterior tibial, and peroneal arteries are patent. There is suboptimal opacification of the right anterior tibial and peroneal arteries at the level of the ankle. The posterior tibial artery appears to be patent. This appearance is similar to study in 2019. LEFT Lower Extremity Inflow: Atherosclerotic disease is noted involving the left common iliac artery. There is an approximately 70% stenosis in the mid to distal left common iliac artery (axial series 1, image 246). The left internal iliac artery is occluded. There is mild-to-moderate stenosis of the proximal left external iliac artery. There is a high-grade stenosis of the left external iliac artery (axial series 1, image 270). This is relatively similar to prior CT in 2019. The remaining portions of the left external iliac artery are widely patent. Outflow: There is mild narrowing of the distal left common femoral artery (axial series 1, image 311). The left profunda femoris and left SFA are patent with mild atherosclerotic disease  throughout. The left popliteal artery is widely patent. Runoff: The left anterior tibial, posterior tibial, and peroneal arteries are patent proximally. There is suboptimal opacification of the left anterior tibial and peroneal arteries at the level of the ankle. The posterior tibial artery appears to be patent. There is a patent lateral and medial plantar arch. Veins: No obvious venous abnormality within the limitations of this arterial phase study. Review of the MIP images confirms the above findings. NON-VASCULAR CT CHEST: Cardiovascular: There is no evidence for thoracic aortic aneurysm or dissection. Atherosclerotic changes are noted of the thoracic aorta. There is no evidence for an acute pulmonary embolism. The heart size is normal. The arch vessels are patent with some mild to moderate narrowing at the origin of the left common carotid artery. There are atherosclerotic changes of both common carotid arteries without evidence for high-grade stenosis. The right vertebral artery is dominant. Mediastinum/Nodes: --No mediastinal or hilar lymphadenopathy. --No axillary lymphadenopathy. --No supraclavicular lymphadenopathy. --Normal thyroid gland. --The esophagus is unremarkable Lungs/Pleura: Emphysematous changes are noted bilaterally. There is a pulmonary nodule in the posterior right upper lobe measuring approximately 6 mm (axial series  1, image 38). There is no pneumothorax or large pleural effusion. Musculoskeletal: No chest wall abnormality. No acute or significant osseous findings. Review of the MIP images confirms the above findings. CT ABDOMEN PELVIS: Hepatobiliary: There is a cystic appearing lesion in the posterior right hepatic lobe, stable from prior study. Normal gallbladder.There is no biliary ductal dilation. Pancreas: Normal contours without ductal dilatation. No peripancreatic fluid collection. Spleen: No splenic laceration or hematoma. Adrenals/Urinary Tract: --Adrenal glands: No adrenal  hemorrhage. --Right kidney/ureter: No hydronephrosis or perinephric hematoma. --Left kidney/ureter: No hydronephrosis or perinephric hematoma. --Urinary bladder: Unremarkable. Stomach/Bowel: --Stomach/Duodenum: There is a large duodenal diverticulum. --Small bowel: No dilatation or inflammation. --Colon: Rectosigmoid diverticulosis without acute inflammation. --Appendix: Normal. Lymphatic: --No retroperitoneal lymphadenopathy. --No mesenteric lymphadenopathy. --No pelvic or inguinal lymphadenopathy. Reproductive: Unremarkable Other: No ascites or free air. There are bilateral fat containing inguinal hernias, right greater than left. Musculoskeletal. No acute displaced fractures. IMPRESSION: 1. No evidence for an acute vascular abnormality. No evidence for dissection. No acute pulmonary embolism. No aortic aneurysm. No evidence for an acute arterial embolism. 2. There is a mild stenosis of the distal right external iliac artery, significantly improved from prior study in 2019. Atherosclerotic disease is noted throughout the right superficial femoral artery resulting in mild-to-moderate narrowing and no evidence for a high-grade stenosis. There is a similar single vessel runoff via the posterior tibial artery on the right. There is questionable opacification of the peroneal and anterior tibial arteries at the level of the ankle. 3. Atherosclerotic changes are noted of the left common iliac artery resulting in approximately 70% stenosis. There is persistent moderate narrowing of the proximal left external iliac artery, slightly improved since 2019. Mild-to-moderate narrowing is noted of the left distal common femoral artery. Again noted is an essentially single vessel runoff of the left lower extremity via the posterior tibial artery. There is minimal opacification of the posterior tibial and peroneal arteries at the level of the ankle. 4. There is a 6 mm pulmonary nodule in the right upper lobe. Non-contrast chest CT  at 3-6 months is recommended. If the nodules are stable at time of repeat CT, then future CT at 18-24 months (from today's scan) is considered optional for low-risk patients, but is recommended for high-risk patients. This recommendation follows the consensus statement: Guidelines for Management of Incidental Pulmonary Nodules Detected on CT Images: From the Fleischner Society 2017; Radiology 2017; 284:228-243. 5. 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 6. There is diverticulosis without CT evidence for diverticulitis. 7. Aortic Atherosclerosis (ICD10-I70.0) and Emphysema (ICD10-J43.9). Electronically Signed   By: Constance Holster M.D.   On: 11/03/2019 21:35   IR ILIAC ART STENT INC PTA MOD SED  Result Date: 11/06/2019 INDICATION: 64 year old male with a history of left-sided blue toe syndrome, presents for treatment of permissive lesion of the left iliac artery Right ABI: 1.03 Left ABI: 0.76 EXAM: ULTRASOUND GUIDED ACCESS RIGHT COMMON FEMORAL ARTERY PELVIC ANGIOGRAM TREATMENT OF LEFT ILIAC ARTERY TANDEM STENOSIS WITH BALLOON ANGIOPLASTY AND BARE METAL STENTING ANGIO-SEAL FOR CLOSURE MEDICATIONS: None ANESTHESIA/SEDATION: Moderate (conscious) sedation was employed during this procedure. A total of Versed 1.0 mg and Fentanyl 50 mcg was administered intravenously. Moderate Sedation Time: 55 minutes. The patient's level of consciousness and vital signs were monitored continuously by radiology nursing throughout the procedure under my direct supervision. CONTRAST:  80 cc IV contrast FLUOROSCOPY TIME:  Fluoroscopy Time: 10 minutes 54 seconds (180 mGy). COMPLICATIONS: None PROCEDURE: Informed consent was obtained  from the patient following explanation of the procedure, risks, benefits and alternatives. The patient understands, agrees and consents for the procedure. All questions were addressed. A time out was performed prior to the initiation of the procedure. Maximal barrier  sterile technique utilized including caps, mask, sterile gowns, sterile gloves, large sterile drape, hand hygiene, and Betadine prep. Ultrasound survey of the right inguinal region was performed with images stored and sent to PACs, confirming patency of the vessel. 1% lidocaine was used for local anesthesia. Small stab incision was made. Blunt dissection was performed under ultrasound guidance to the common femoral artery. A micropuncture needle was used access the right common femoral artery under ultrasound. With excellent arterial blood flow returned, and an .018 micro wire was passed through the needle, observed enter the abdominal aorta under fluoroscopy. The needle was removed, and a micropuncture sheath was placed over the wire. The inner dilator and wire were removed, and an 035 Bentson wire was advanced under fluoroscopy into the abdominal aorta. The sheath was removed and a standard 5 [place] vascular sheath was placed. The dilator was removed and the sheath was flushed. Omni Flush catheter was passed on the Bentson wire to the pelvic aorta. Angiogram was performed. Bentson wire was then used to navigate the Omni Flush catheter over the bifurcation. Bentson wire was removed and a roadrunner wire was then used to navigate to the distal external iliac artery. Omni Flush catheter was passed to the distal external iliac artery and a rose in wire was placed. Six French to room 0 55 cm sheath was placed on the Rose an wire to the distal external iliac artery. Wire was removed and then the Bentson wire and Kumpe the catheter were used to navigate the wire into the proximal SFA. With a proximal position of the SFA, the Kumpe the catheter was then used to deliver a rose in wire into the SFA. The sheath was then withdrawn to the common iliac artery and a target angiogram was performed of the left iliac system. Measurements were made. We elected to place an initial 8 mm x 80 mm bare metal stent which was deployed  distally. We then deployed in overlapping 9 mm by 30 mm stent in the common iliac artery. Balloon angioplasty was then performed with 8 mm x 40 mm standard balloon angioplasty. Repeat angiogram was performed. Angio-Seal was then deployed for hemostasis. Sterile bandage was placed. Patient tolerated the procedure well and remained hemodynamically stable throughout. No complications were encountered and no significant blood loss. FINDINGS: Pelvic angiogram demonstrates minimal atherosclerosis of the distal aorta with patency of the median sacral artery. No significant stenosis of the right iliac system with patency of the hypogastric artery maintained. Mild atherosclerosis of the left gastric artery with patency of the gluteal artery. No significant stenosis of the right common femoral artery. Greater than 80% stenosis of tandem lesions of the left common iliac artery with irregular plaque, as well as the mid external iliac artery with irregular plaque. Attenuated flow through the left hypogastric artery, though remains patent with moderate to advanced atherosclerosis. The gluteal arteries remain patent on the left, before and after treatment. Status post treatment with balloon angioplasty and stenting there is 0% residual stenosis of the left iliac system. IMPRESSION: Ultrasound-guided access right common femoral artery for pelvic angiogram and treatment of tandem stenoses of the left iliac system with bare metal stenting and balloon angioplasty, with no residual stenosis. Angio-Seal for hemostasis. Signed, Dulcy Fanny. Dellia Nims, [place] [facility] Radiology Electronically Signed   By: Corrie Mckusick D.O.   On: 11/06/2019 12:14   IR US Guide Vasc Access Right  Result Date: 11/06/2019 INDICATION: 64 year old male with a history of left-sided blue toe syndrome, presents for treatment of permissive lesion of the left iliac artery Right ABI: 1.03 Left ABI: 0.76 EXAM:  ULTRASOUND GUIDED ACCESS RIGHT COMMON FEMORAL ARTERY PELVIC ANGIOGRAM TREATMENT OF LEFT ILIAC ARTERY TANDEM STENOSIS WITH BALLOON ANGIOPLASTY AND BARE METAL STENTING ANGIO-SEAL FOR CLOSURE MEDICATIONS: None ANESTHESIA/SEDATION: Moderate (conscious) sedation was employed during this procedure. A total of Versed 1.0 mg and Fentanyl 50 mcg was administered intravenously. Moderate Sedation Time: 55 minutes. The patient's level of consciousness and vital signs were monitored continuously by radiology nursing throughout the procedure under my direct supervision. CONTRAST:  80 cc IV contrast FLUOROSCOPY TIME:  Fluoroscopy Time: 10 minutes 54 seconds (180 mGy). COMPLICATIONS: None PROCEDURE: Informed consent was obtained from the patient following explanation of the procedure, risks, benefits and alternatives. The patient understands, agrees and consents for the procedure. All questions were addressed. A time out was performed prior to the initiation of the procedure. Maximal barrier sterile technique utilized including caps, mask, sterile gowns, sterile gloves, large sterile drape, hand hygiene, and Betadine prep. Ultrasound survey of the right inguinal region was performed with images stored and sent to PACs, confirming patency of the vessel. 1% lidocaine was used for local anesthesia. Small stab incision was made. Blunt dissection was performed under ultrasound guidance to the common femoral artery. A micropuncture needle was used access the right common femoral artery under ultrasound. With excellent arterial blood flow returned, and an .018 micro wire was passed through the needle, observed enter the abdominal aorta under fluoroscopy. The needle was removed, and a micropuncture sheath was placed over the wire. The inner dilator and wire were removed, and an 035 Bentson wire was advanced under fluoroscopy into the abdominal aorta. The sheath was removed and a standard 5 [place] vascular sheath was placed. The dilator was  removed and the sheath was flushed. Omni Flush catheter was passed on the Bentson wire to the pelvic aorta. Angiogram was performed. Bentson wire was then used to navigate the Omni Flush catheter over the bifurcation. Bentson wire was removed and a roadrunner wire was then used to navigate to the distal external iliac artery. Omni Flush catheter was passed to the distal external iliac artery and a rose in wire was placed. Six French to room 0 55 cm sheath was placed on the Rose an wire to the distal external iliac artery. Wire was removed and then the Bentson wire and Kumpe the catheter were used to navigate the wire into the proximal SFA. With a proximal position of the SFA, the Kumpe the catheter was then used to deliver a rose in wire into the SFA. The sheath was then withdrawn to the common iliac artery and a target angiogram was performed of the left iliac system. Measurements were made. We elected to place an initial 8 mm x 80 mm bare metal stent which was deployed distally. We then deployed in overlapping 9 mm by 30 mm stent in the common iliac artery. Balloon angioplasty was then performed with 8 mm x 40 mm standard balloon angioplasty. Repeat angiogram was performed. Angio-Seal was then deployed for hemostasis. Sterile bandage was placed. Patient tolerated the procedure well and remained hemodynamically stable throughout. No complications were encountered and no significant blood loss. FINDINGS: Pelvic angiogram demonstrates minimal atherosclerosis  of the distal aorta with patency of the median sacral artery. No significant stenosis of the right iliac system with patency of the hypogastric artery maintained. Mild atherosclerosis of the left gastric artery with patency of the gluteal artery. No significant stenosis of the right common femoral artery. Greater than 80% stenosis of tandem lesions of the left common iliac artery with irregular plaque, as well as the mid external iliac artery with irregular  plaque. Attenuated flow through the left hypogastric artery, though remains patent with moderate to advanced atherosclerosis. The gluteal arteries remain patent on the left, before and after treatment. Status post treatment with balloon angioplasty and stenting there is 0% residual stenosis of the left iliac system. IMPRESSION: Ultrasound-guided access right common femoral artery for pelvic angiogram and treatment of tandem stenoses of the left iliac system with bare metal stenting and balloon angioplasty, with no residual stenosis. Angio-Seal for hemostasis. Signed, Dulcy Fanny. Dellia Nims, RPVI [facility] Radiology Electronically Signed   By: Corrie Mckusick D.O.   On: 11/06/2019 12:14   CT ANGIO CHEST AORTA W/CM &/OR WO/CM  Result Date: 11/03/2019 CLINICAL DATA:  Great toe discoloration. EXAM: CT ANGIOGRAPHY OF ABDOMINAL AORTA WITH ILIOFEMORAL RUNOFF CT ANGIO CHEST TECHNIQUE: Multidetector CT imaging of the abdomen, pelvis and lower extremities was performed using the standard protocol during bolus administration of intravenous contrast. Multiplanar CT image reconstructions and MIPs were obtained to evaluate the vascular anatomy. Multidetector CT imaging of the chest, abdomen, pelvis and lower extremities was performed using the standard protocol during bolus administration of intravenous contrast. Multiplanar CT image reconstructions and MIPs were obtained to evaluate the vascular anatomy. CONTRAST:  184mL OMNIPAQUE IOHEXOL 350 MG/ML SOLN COMPARISON:  None. CT angio dated March 06, 2018. FINDINGS: VASCULAR Aorta: There are atherosclerotic changes throughout the abdominal aorta without evidence for an aneurysm or dissection. Celiac: There is moderate narrowing at the origin of the celiac axis which may be secondary to atherosclerotic disease or median arcuate ligament syndrome there is some mild poststenotic dilatation. SMA: Patent without evidence of aneurysm,  dissection, vasculitis or significant stenosis. Renals: There are 2 patent right renal arteries. There are 2 patent left renal arteries. IMA: Patent without evidence of aneurysm, dissection, vasculitis or significant stenosis. RIGHT Lower Extremity Inflow: There are atherosclerotic changes of the common iliac artery without evidence for high-grade stenosis. There is a less than 50% stenosis at the origin of the right external iliac artery. There is improved luminal diameter of the distal right external iliac artery when compared to prior study in 2019 consistent with a positive response to treatment. There may be an approximately 50% stenosis at this level. Outflow: There is mild narrowing of the right common femoral artery. There is mild atherosclerotic disease throughout the right profundus femoris and right superficial femoral arteries without evidence for a high-grade stenosis. The right popliteal artery is patent without evidence for high-grade stenosis. Runoff: The proximal anterior tibial, posterior tibial, and peroneal arteries are patent. There is suboptimal opacification of the right anterior tibial and peroneal arteries at the level of the ankle. The posterior tibial artery appears to be patent. This appearance is similar to study in 2019. LEFT Lower Extremity Inflow: Atherosclerotic disease is noted involving the left common iliac artery. There is an approximately 70% stenosis in the mid to distal left common iliac artery (axial series 1, image 246). The left internal iliac artery is occluded. There is mild-to-moderate stenosis of the proximal left external iliac artery. There  is a high-grade stenosis of the left external iliac artery (axial series 1, image 270). This is relatively similar to prior CT in 2019. The remaining portions of the left external iliac artery are widely patent. Outflow: There is mild narrowing of the distal left common femoral artery (axial series 1, image 311). The left profunda  femoris and left SFA are patent with mild atherosclerotic disease throughout. The left popliteal artery is widely patent. Runoff: The left anterior tibial, posterior tibial, and peroneal arteries are patent proximally. There is suboptimal opacification of the left anterior tibial and peroneal arteries at the level of the ankle. The posterior tibial artery appears to be patent. There is a patent lateral and medial plantar arch. Veins: No obvious venous abnormality within the limitations of this arterial phase study. Review of the MIP images confirms the above findings. NON-VASCULAR CT CHEST: Cardiovascular: There is no evidence for thoracic aortic aneurysm or dissection. Atherosclerotic changes are noted of the thoracic aorta. There is no evidence for an acute pulmonary embolism. The heart size is normal. The arch vessels are patent with some mild to moderate narrowing at the origin of the left common carotid artery. There are atherosclerotic changes of both common carotid arteries without evidence for high-grade stenosis. The right vertebral artery is dominant. Mediastinum/Nodes: --No mediastinal or hilar lymphadenopathy. --No axillary lymphadenopathy. --No supraclavicular lymphadenopathy. --Normal thyroid gland. --The esophagus is unremarkable Lungs/Pleura: Emphysematous changes are noted bilaterally. There is a pulmonary nodule in the posterior right upper lobe measuring approximately 6 mm (axial series 1, image 38). There is no pneumothorax or large pleural effusion. Musculoskeletal: No chest wall abnormality. No acute or significant osseous findings. Review of the MIP images confirms the above findings. CT ABDOMEN PELVIS: Hepatobiliary: There is a cystic appearing lesion in the posterior right hepatic lobe, stable from prior study. Normal gallbladder.There is no biliary ductal dilation. Pancreas: Normal contours without ductal dilatation. No peripancreatic fluid collection. Spleen: No splenic laceration or  hematoma. Adrenals/Urinary Tract: --Adrenal glands: No adrenal hemorrhage. --Right kidney/ureter: No hydronephrosis or perinephric hematoma. --Left kidney/ureter: No hydronephrosis or perinephric hematoma. --Urinary bladder: Unremarkable. Stomach/Bowel: --Stomach/Duodenum: There is a large duodenal diverticulum. --Small bowel: No dilatation or inflammation. --Colon: Rectosigmoid diverticulosis without acute inflammation. --Appendix: Normal. Lymphatic: --No retroperitoneal lymphadenopathy. --No mesenteric lymphadenopathy. --No pelvic or inguinal lymphadenopathy. Reproductive: Unremarkable Other: No ascites or free air. There are bilateral fat containing inguinal hernias, right greater than left. Musculoskeletal. No acute displaced fractures. IMPRESSION: 1. No evidence for an acute vascular abnormality. No evidence for dissection. No acute pulmonary embolism. No aortic aneurysm. No evidence for an acute arterial embolism. 2. There is a mild stenosis of the distal right external iliac artery, significantly improved from prior study in 2019. Atherosclerotic disease is noted throughout the right superficial femoral artery resulting in mild-to-moderate narrowing and no evidence for a high-grade stenosis. There is a similar single vessel runoff via the posterior tibial artery on the right. There is questionable opacification of the peroneal and anterior tibial arteries at the level of the ankle. 3. Atherosclerotic changes are noted of the left common iliac artery resulting in approximately 70% stenosis. There is persistent moderate narrowing of the proximal left external iliac artery, slightly improved since 2019. Mild-to-moderate narrowing is noted of the left distal common femoral artery. Again noted is an essentially single vessel runoff of the left lower extremity via the posterior tibial artery. There is minimal opacification of the posterior tibial and peroneal arteries at the level of the ankle. 4. There  is a 6 mm  pulmonary nodule in the right upper lobe. Non-contrast chest CT at 3-6 months is recommended. If the nodules are stable at time of repeat CT, then future CT at 18-24 months (from today's scan) is considered optional for low-risk patients, but is recommended for high-risk patients. This recommendation follows the consensus statement: Guidelines for Management of Incidental Pulmonary Nodules Detected on CT Images: From the Fleischner Society 2017; Radiology 2017; 284:228-243. 5. 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 6. There is diverticulosis without CT evidence for diverticulitis. 7. Aortic Atherosclerosis (ICD10-I70.0) and Emphysema (ICD10-J43.9). Electronically Signed   By: Constance Holster M.D.   On: 11/03/2019 21:35   VAS [place] ABI WITH/WO TBI  Result Date: 11/05/2019 LOWER EXTREMITY DOPPLER STUDY Indications: Peripheral artery disease, and Blue toe syndrome. High Risk Factors: Hypertension, Diabetes, current smoker.  Vascular Interventions: Balloon angioplasty of left external iliac artery. Comparison Study: No prior study on file Performing Technologist: Sharion Dove RVS  Examination Guidelines: A complete evaluation includes at minimum, Doppler waveform signals and systolic blood pressure reading at the level of bilateral brachial, anterior tibial, and posterior tibial arteries, when vessel segments are accessible. Bilateral testing is considered an integral part of a complete examination. Photoelectric Plethysmograph (PPG) waveforms and toe systolic pressure readings are included as required and additional duplex testing as needed. Limited examinations for reoccurring indications may be performed as noted.  ABI Findings: +---------+------------------+-----+---------+--------+ Right    Rt Pressure (mmHg)IndexWaveform Comment  +---------+------------------+-----+---------+--------+ Brachial 170                    triphasic          +---------+------------------+-----+---------+--------+ PTA      179               1.03 biphasic          +---------+------------------+-----+---------+--------+ DP       166               0.96 biphasic          +---------+------------------+-----+---------+--------+ Great Toe112               0.65                   +---------+------------------+-----+---------+--------+ +---------+------------------+-----+---------+-------+ Left     Lt Pressure (mmHg)IndexWaveform Comment +---------+------------------+-----+---------+-------+ Brachial 173                    triphasic        +---------+------------------+-----+---------+-------+ ATA      131               0.76 biphasic         +---------+------------------+-----+---------+-------+ DP       118               0.68 biphasic         +---------+------------------+-----+---------+-------+ Great Toe50                0.29                  +---------+------------------+-----+---------+-------+ +-------+-----------+-----------+------------+------------+ ABI/TBIToday's ABIToday's TBIPrevious ABIPrevious TBI +-------+-----------+-----------+------------+------------+ Right  1.03       0.65                                +-------+-----------+-----------+------------+------------+ Left   0.68       0.29                                +-------+-----------+-----------+------------+------------+  Summary: Right: Resting right ankle-brachial index is within normal range. No evidence of significant right lower extremity arterial disease. The right toe-brachial index is abnormal. Left: Resting left ankle-brachial index indicates moderate left lower extremity arterial disease. The left toe-brachial index is abnormal.  *See table(s) above for measurements and observations.  Electronically signed by Ruta Hinds MD on 11/05/2019 at 5:58:52 PM.    Final      Subjective: Eager to go home today  Discharge Exam: Vitals:    11/06/19 1413 11/06/19 1455  BP: (!) 124/57 (!) 151/56  Pulse: 75 65  Resp: 16 16  Temp: 98.1 F (36.7 C)   SpO2: 96% 96%   Vitals:   11/06/19 1230 11/06/19 1341 11/06/19 1413 11/06/19 1455  BP:  (!) 131/48 (!) 124/57 (!) 151/56  Pulse: (!) 56 63 75 65  Resp:  16 16 16   Temp:   98.1 F (36.7 C)   TempSrc:   Oral   SpO2: 98% 96% 96% 96%  Weight:      Height:        General: Pt is alert, awake, not in acute distress Cardiovascular: RRR, S1/S2 +, no rubs, no gallops Respiratory: CTA bilaterally, no wheezing, no rhonchi Abdominal: Soft, NT, ND, bowel sounds + Extremities: no edema, no cyanosis   The results of significant diagnostics from this hospitalization (including imaging, microbiology, ancillary and laboratory) are listed below for reference.     Microbiology: Recent Results (from the past 240 hour(s))  SARS CORONAVIRUS 2 (TAT 6-24 HRS) Nasopharyngeal Nasopharyngeal Swab     Status: None   Collection Time: 11/03/19  9:51 PM   Specimen: Nasopharyngeal Swab  Result Value Ref Range Status   SARS Coronavirus 2 NEGATIVE NEGATIVE Final    Comment: (NOTE) SARS-CoV-2 target nucleic acids are NOT DETECTED. The SARS-CoV-2 RNA is generally detectable in upper and lower respiratory specimens during the acute phase of infection. Negative results do not preclude SARS-CoV-2 infection, do not rule out co-infections with other pathogens, and should not be used as the sole basis for treatment or other patient management decisions. Negative results must be combined with clinical observations, patient history, and epidemiological information. The expected result is Negative. Fact Sheet for Patients: SugarRoll.be Fact Sheet for Healthcare Providers: https://www.woods-mathews.com/ This test is not yet approved or cleared by the [place] FDA and  has been authorized for detection and/or diagnosis of SARS-CoV-2 by FDA under an Emergency  Use Authorization (EUA). This EUA will remain  in effect (meaning this test can be used) for the duration of the COVID-19 declaration under Section 56 4(b)(1) of the Act, 21 U.S.C. section 360bbb-3(b)(1), unless the authorization is terminated or revoked sooner. Performed at [facility], [place] [address] [place], [place] [address]   MRSA PCR Screening     Status: None   Collection Time: 11/04/19 12:50 AM   Specimen: Nasopharyngeal  Result Value Ref Range Status   MRSA by PCR NEGATIVE NEGATIVE Final    Comment:        The GeneXpert MRSA Assay (FDA approved for NASAL specimens only), is one component of a comprehensive MRSA colonization surveillance program. It is not intended to diagnose MRSA infection nor to guide or monitor treatment for MRSA infections. Performed at [facility], [place] [address] [place], [place] [address]      Labs: BNP (last 3 results) No results for input(s): BNP in the last 8760 hours. Basic Metabolic Panel: Recent Labs  Lab 11/03/19 1804  NA  141  K 4.7  CL 109  CO2 24  GLUCOSE 107*  BUN 15  CREATININE 1.21  CALCIUM 9.9   Liver Function Tests: Recent Labs  Lab 11/03/19 1804  AST 20  ALT 27  ALKPHOS 67  BILITOT 0.8  PROT 6.7  ALBUMIN 4.2   No results for input(s): LIPASE, AMYLASE in the last 168 hours. No results for input(s): AMMONIA in the last 168 hours. CBC: Recent Labs  Lab 11/03/19 1804 11/04/19 0239 11/05/19 0136 11/06/19 0638  WBC 11.0* 9.7 9.8 8.6  HGB 14.6 12.8* 13.1 13.2  HCT 43.8 37.4* 38.3* 39.4  MCV 95.6 93.3 94.3 94.5  PLT 253 180 190 189   Cardiac Enzymes: No results for input(s): CKTOTAL, CKMB, CKMBINDEX, TROPONINI in the last 168 hours. BNP: Invalid input(s): POCBNP CBG: Recent Labs  Lab 11/05/19 0640 11/05/19 1109 11/05/19 1608 11/05/19 2142 11/06/19 0635  GLUCAP 119* 149* 113* 149* 119*   D-Dimer No results for input(s): DDIMER in the last 72 hours. Hgb A1c Recent Labs     11/03/19 1804  HGBA1C 7.1*   Lipid Profile No results for input(s): CHOL, HDL, LDLCALC, TRIG, CHOLHDL, LDLDIRECT in the last 72 hours. Thyroid function studies No results for input(s): TSH, T4TOTAL, T3FREE, THYROIDAB in the last 72 hours.  Invalid input(s): FREET3 Anemia work up No results for input(s): VITAMINB12, FOLATE, FERRITIN, TIBC, IRON, RETICCTPCT in the last 72 hours. Urinalysis    Component Value Date/Time   COLORURINE YELLOW 11/03/2019 1855   APPEARANCEUR CLEAR 11/03/2019 1855   LABSPEC 1.012 11/03/2019 1855   PHURINE 6.0 11/03/2019 1855   GLUCOSEU NEGATIVE 11/03/2019 1855   HGBUR NEGATIVE 11/03/2019 1855   BILIRUBINUR NEGATIVE 11/03/2019 1855   KETONESUR NEGATIVE 11/03/2019 1855   PROTEINUR NEGATIVE 11/03/2019 1855   NITRITE NEGATIVE 11/03/2019 1855   LEUKOCYTESUR NEGATIVE 11/03/2019 1855   Sepsis Labs Invalid input(s): PROCALCITONIN,  WBC,  LACTICIDVEN Microbiology Recent Results (from the past 240 hour(s))  SARS CORONAVIRUS 2 (TAT 6-24 HRS) Nasopharyngeal Nasopharyngeal Swab     Status: None   Collection Time: 11/03/19  9:51 PM   Specimen: Nasopharyngeal Swab  Result Value Ref Range Status   SARS Coronavirus 2 NEGATIVE NEGATIVE Final    Comment: (NOTE) SARS-CoV-2 target nucleic acids are NOT DETECTED. The SARS-CoV-2 RNA is generally detectable in upper and lower respiratory specimens during the acute phase of infection. Negative results do not preclude SARS-CoV-2 infection, do not rule out co-infections with other pathogens, and should not be used as the sole basis for treatment or other patient management decisions. Negative results must be combined with clinical observations, patient history, and epidemiological information. The expected result is Negative. Fact Sheet for Patients: SugarRoll.be Fact Sheet for Healthcare Providers: https://www.woods-mathews.com/ This test is not yet approved or cleared by the  [place] FDA and  has been authorized for detection and/or diagnosis of SARS-CoV-2 by FDA under an Emergency Use Authorization (EUA). This EUA will remain  in effect (meaning this test can be used) for the duration of the COVID-19 declaration under Section 56 4(b)(1) of the Act, 21 U.S.C. section 360bbb-3(b)(1), unless the authorization is terminated or revoked sooner. Performed at [facility], [place] [address], [place], [place] [address]   MRSA PCR Screening     Status: None   Collection Time: 11/04/19 12:50 AM   Specimen: Nasopharyngeal  Result Value Ref Range Status   MRSA by PCR NEGATIVE NEGATIVE Final    Comment:  The GeneXpert MRSA Assay (FDA approved for NASAL specimens only), is one component of a comprehensive MRSA colonization surveillance program. It is not intended to diagnose MRSA infection nor to guide or monitor treatment for MRSA infections. Performed at [facility], [place] [address] [place], [place] [address]    Time spent: 26min   SIGNED:   Marylu Lund, MD  [facility] 11/06/2019, 4:38 PM  If 7PM-7AM, please contact night-coverage

## 2019-11-06 NOTE — Procedures (Signed)
Interventional Radiology Procedure Note  Procedure:  US guided right CFA access, pelvic angiogram, treatment of left CIA and EIA tandem stenosis, contributing to blue to syndrome.   Findings:  Pre: 80% stenosis tandem CIA/EIA Post: 0% residual stenosis.  Intact pulses.  Complications: None Recommendations:  - Right hip straight x 4 hours - continue dual anti-platelet with 75mg  plavix and 81mg  asa daily.  Plavix only for 90 days then withdraw and continue asa daily - Do not restart heparin - Do not submerge for 7 days - Routine wound care  - Follow up with Dr. Earleen Newport in 4-6 weeks with repeat ABI/non-invasive exam at [facility]  Signed,  Dulcy Fanny. Earleen Newport, DO

## 2019-11-06 NOTE — Plan of Care (Signed)
  Problem: Pain Managment: Goal: General experience of comfort will improve Outcome: Progressing   Problem: Safety: Goal: Ability to remain free from injury will improve Outcome: Progressing   Problem: Skin Integrity: Goal: Risk for impaired skin integrity will decrease Outcome: Progressing   

## 2019-11-06 NOTE — Progress Notes (Signed)
Pt did well after procedure, bedrest up, walked in halls with therapy, did well therapist stated. Pt wanting to go home. Dr Wyline Copas has not rounded on pt as of yet. Will contact him and let him know pt would like to be able to go home.  Wife at bedside.

## 2019-11-12 ENCOUNTER — Encounter: Payer: Self-pay | Admitting: *Deleted

## 2019-11-20 DIAGNOSIS — Z6822 Body mass index (BMI) 22.0-22.9, adult: Secondary | ICD-10-CM | POA: Diagnosis not present

## 2019-11-20 DIAGNOSIS — Z09 Encounter for follow-up examination after completed treatment for conditions other than malignant neoplasm: Secondary | ICD-10-CM | POA: Diagnosis not present

## 2019-11-20 DIAGNOSIS — Z720 Tobacco use: Secondary | ICD-10-CM | POA: Diagnosis not present

## 2019-11-20 DIAGNOSIS — I739 Peripheral vascular disease, unspecified: Secondary | ICD-10-CM | POA: Diagnosis not present

## 2019-11-23 ENCOUNTER — Encounter (HOSPITAL_COMMUNITY): Payer: Self-pay | Admitting: Internal Medicine

## 2019-11-26 DIAGNOSIS — I745 Embolism and thrombosis of iliac artery: Secondary | ICD-10-CM | POA: Diagnosis not present

## 2019-11-26 DIAGNOSIS — I739 Peripheral vascular disease, unspecified: Secondary | ICD-10-CM | POA: Diagnosis not present

## 2019-11-26 DIAGNOSIS — I75022 Atheroembolism of left lower extremity: Secondary | ICD-10-CM | POA: Diagnosis not present

## 2019-11-26 DIAGNOSIS — Z9889 Other specified postprocedural states: Secondary | ICD-10-CM | POA: Diagnosis not present

## 2019-11-26 DIAGNOSIS — R230 Cyanosis: Secondary | ICD-10-CM | POA: Diagnosis not present

## 2020-02-12 DIAGNOSIS — N401 Enlarged prostate with lower urinary tract symptoms: Secondary | ICD-10-CM | POA: Diagnosis not present

## 2020-02-12 DIAGNOSIS — I10 Essential (primary) hypertension: Secondary | ICD-10-CM | POA: Diagnosis not present

## 2020-02-12 DIAGNOSIS — E119 Type 2 diabetes mellitus without complications: Secondary | ICD-10-CM | POA: Diagnosis not present

## 2020-02-12 DIAGNOSIS — E785 Hyperlipidemia, unspecified: Secondary | ICD-10-CM | POA: Diagnosis not present

## 2020-03-03 DIAGNOSIS — L570 Actinic keratosis: Secondary | ICD-10-CM | POA: Diagnosis not present

## 2020-03-03 DIAGNOSIS — D485 Neoplasm of uncertain behavior of skin: Secondary | ICD-10-CM | POA: Diagnosis not present

## 2020-03-17 DIAGNOSIS — L255 Unspecified contact dermatitis due to plants, except food: Secondary | ICD-10-CM | POA: Diagnosis not present

## 2020-03-17 DIAGNOSIS — R21 Rash and other nonspecific skin eruption: Secondary | ICD-10-CM | POA: Diagnosis not present

## 2020-06-10 DIAGNOSIS — E119 Type 2 diabetes mellitus without complications: Secondary | ICD-10-CM | POA: Diagnosis not present

## 2020-06-10 DIAGNOSIS — I739 Peripheral vascular disease, unspecified: Secondary | ICD-10-CM | POA: Diagnosis not present

## 2020-06-10 DIAGNOSIS — I10 Essential (primary) hypertension: Secondary | ICD-10-CM | POA: Diagnosis not present

## 2020-06-10 DIAGNOSIS — E785 Hyperlipidemia, unspecified: Secondary | ICD-10-CM | POA: Diagnosis not present

## 2020-07-03 DIAGNOSIS — L821 Other seborrheic keratosis: Secondary | ICD-10-CM | POA: Diagnosis not present

## 2020-07-03 DIAGNOSIS — L570 Actinic keratosis: Secondary | ICD-10-CM | POA: Diagnosis not present

## 2020-07-03 DIAGNOSIS — L578 Other skin changes due to chronic exposure to nonionizing radiation: Secondary | ICD-10-CM | POA: Diagnosis not present

## 2020-07-03 DIAGNOSIS — C4442 Squamous cell carcinoma of skin of scalp and neck: Secondary | ICD-10-CM | POA: Diagnosis not present

## 2020-09-29 DIAGNOSIS — M791 Myalgia, unspecified site: Secondary | ICD-10-CM | POA: Diagnosis not present

## 2020-09-29 DIAGNOSIS — R059 Cough, unspecified: Secondary | ICD-10-CM | POA: Diagnosis not present

## 2020-09-29 DIAGNOSIS — R062 Wheezing: Secondary | ICD-10-CM | POA: Diagnosis not present

## 2020-09-29 DIAGNOSIS — R0602 Shortness of breath: Secondary | ICD-10-CM | POA: Diagnosis not present

## 2020-09-29 DIAGNOSIS — R051 Acute cough: Secondary | ICD-10-CM | POA: Diagnosis not present

## 2020-09-29 DIAGNOSIS — Z20828 Contact with and (suspected) exposure to other viral communicable diseases: Secondary | ICD-10-CM | POA: Diagnosis not present

## 2020-10-14 DIAGNOSIS — E785 Hyperlipidemia, unspecified: Secondary | ICD-10-CM | POA: Diagnosis not present

## 2020-10-14 DIAGNOSIS — I739 Peripheral vascular disease, unspecified: Secondary | ICD-10-CM | POA: Diagnosis not present

## 2020-10-14 DIAGNOSIS — E1169 Type 2 diabetes mellitus with other specified complication: Secondary | ICD-10-CM | POA: Diagnosis not present

## 2020-10-14 DIAGNOSIS — I10 Essential (primary) hypertension: Secondary | ICD-10-CM | POA: Diagnosis not present

## 2021-02-24 DIAGNOSIS — I10 Essential (primary) hypertension: Secondary | ICD-10-CM | POA: Diagnosis not present

## 2021-02-24 DIAGNOSIS — Z1331 Encounter for screening for depression: Secondary | ICD-10-CM | POA: Diagnosis not present

## 2021-02-24 DIAGNOSIS — E1169 Type 2 diabetes mellitus with other specified complication: Secondary | ICD-10-CM | POA: Diagnosis not present

## 2021-02-24 DIAGNOSIS — I739 Peripheral vascular disease, unspecified: Secondary | ICD-10-CM | POA: Diagnosis not present

## 2021-02-24 DIAGNOSIS — E785 Hyperlipidemia, unspecified: Secondary | ICD-10-CM | POA: Diagnosis not present

## 2021-05-18 DIAGNOSIS — E1169 Type 2 diabetes mellitus with other specified complication: Secondary | ICD-10-CM | POA: Diagnosis not present

## 2021-05-18 DIAGNOSIS — E785 Hyperlipidemia, unspecified: Secondary | ICD-10-CM | POA: Diagnosis not present

## 2021-05-18 DIAGNOSIS — I10 Essential (primary) hypertension: Secondary | ICD-10-CM | POA: Diagnosis not present

## 2021-05-18 DIAGNOSIS — I739 Peripheral vascular disease, unspecified: Secondary | ICD-10-CM | POA: Diagnosis not present

## 2021-06-09 IMAGING — XA IR US GUIDE VASC ACCESS RIGHT
11 of 13 series · 12 of 24 positions shown · IV contrast (IODINE)
Comparison: none

INDICATION: 63-year-old male with a history of left-sided blue toe syndrome,
presents for treatment of permissive lesion of the left iliac artery

[Series 1: body 4 care · 2 of 18 slices shown (1 of 9)]
[im 6/18]
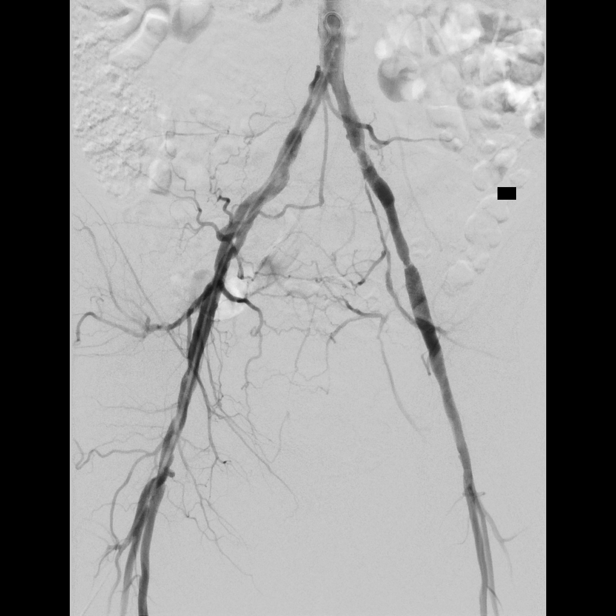
[im 18/18]
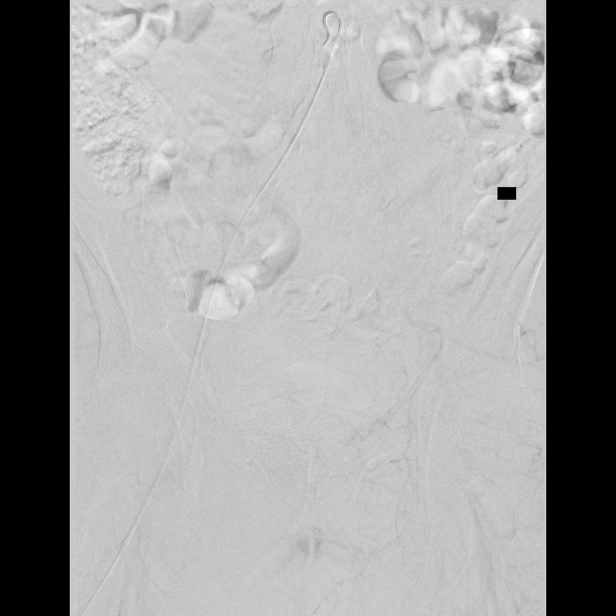

[Series 2: body 4 care · 1 of 16 slices shown (2 of 9)]
[im 8/16]
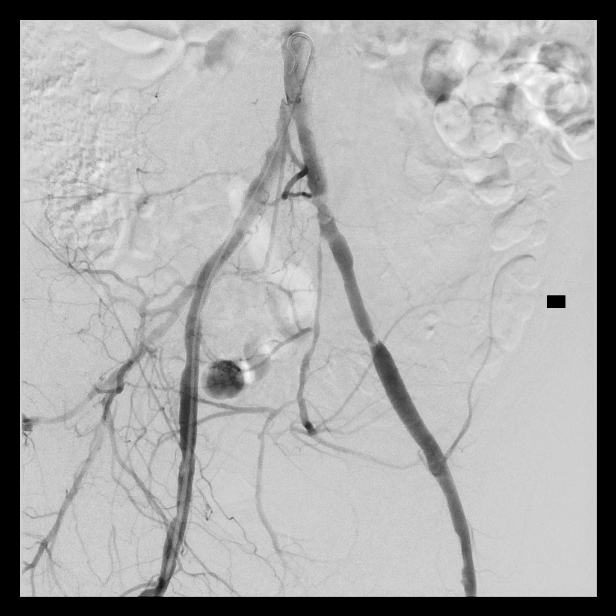

[Series 3: body 4 care · 1 of 5 slices shown (3 of 9)]
[im 1/5]
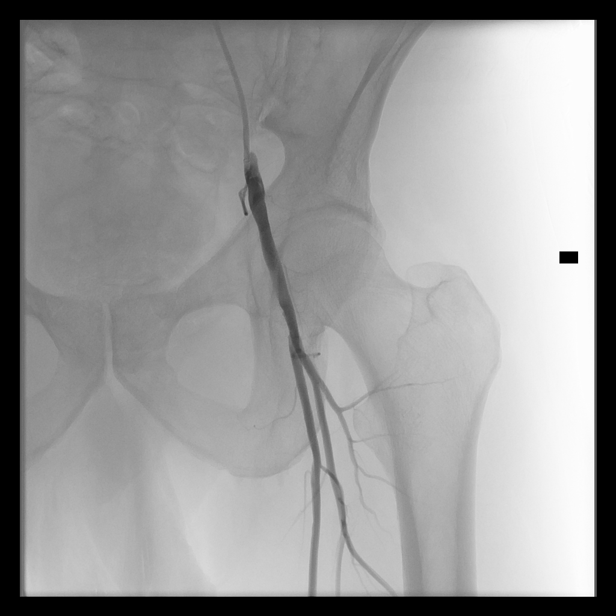

[Series 4: body 4 care · 1 of 7 slices shown (4 of 9)]
[im 7/7]
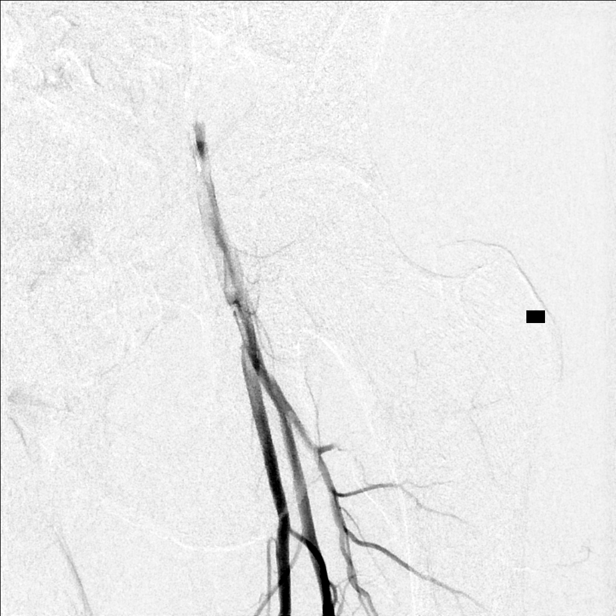

[Series 5: body 4 care · 1 of 9 slices shown (5 of 9)]
[im 9/9]
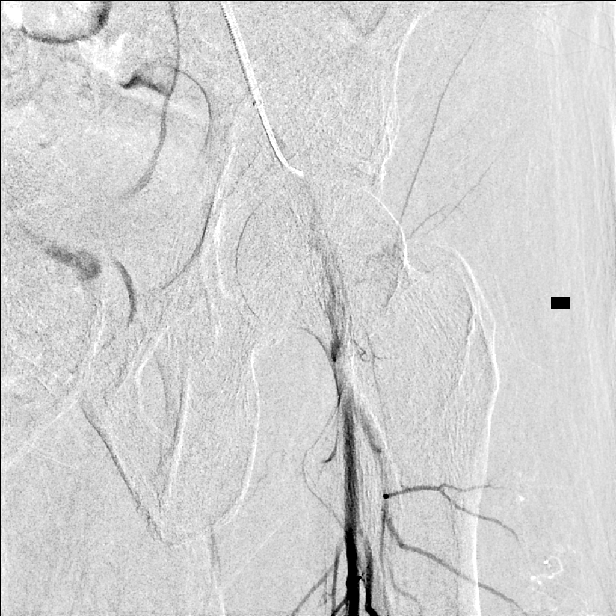

[Series 6: body 4 care · 1 of 6 slices shown (6 of 9)]
[im 6/6]
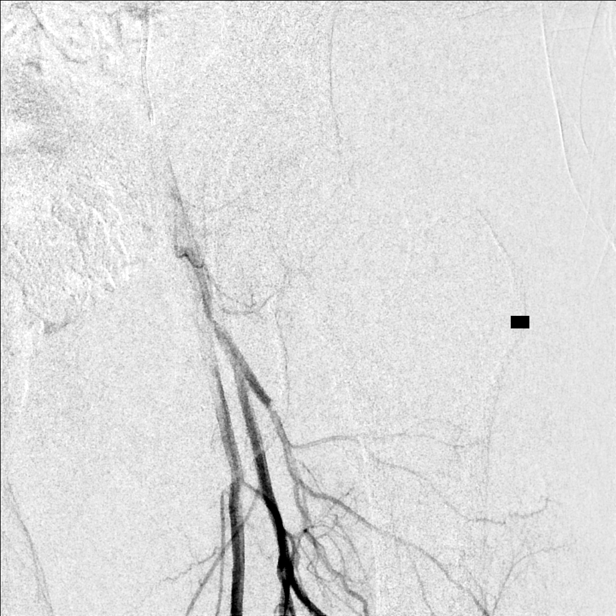

[Series 7: body 4 care · 1 of 8 slices shown (7 of 9)]
[im 8/8]
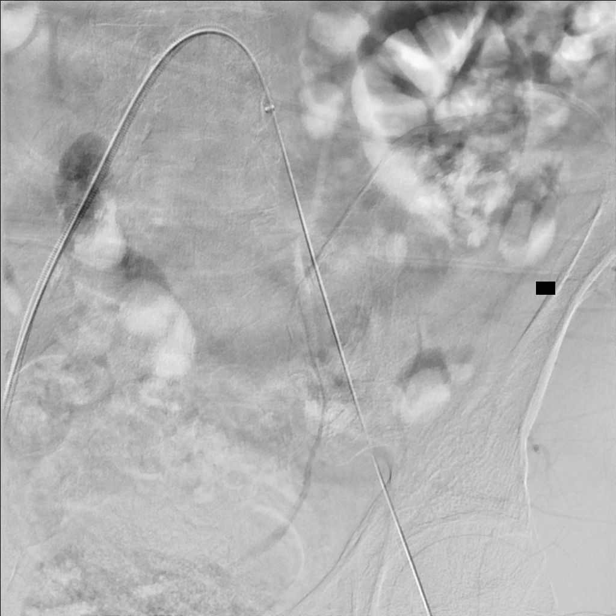

[Series 9: single · 1 of 2 slices shown (1 of 2)]
[im 1/2]
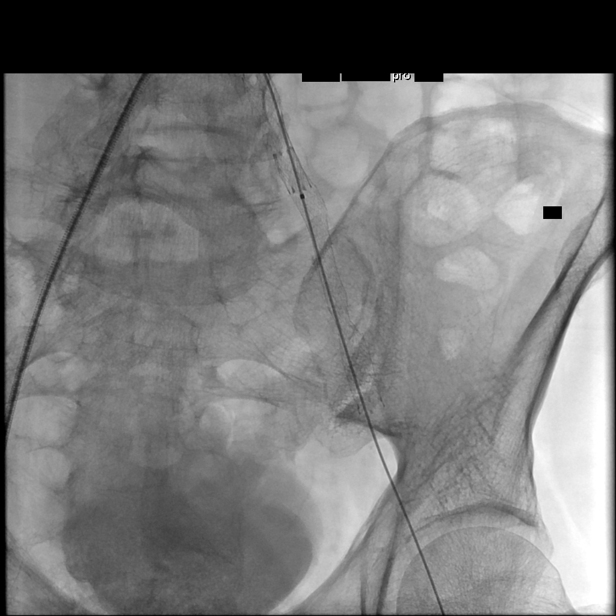

[Series 11: single · 1 of 1 slices shown (2 of 2)]
[im 1/1]
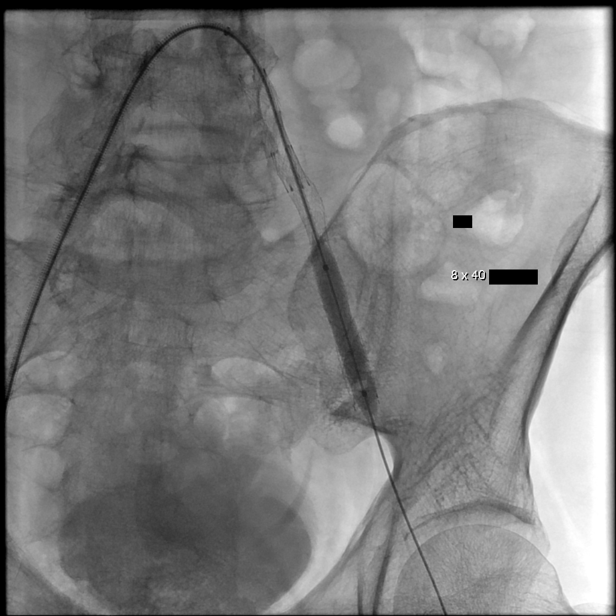

[Series 18: body 4 care · 1 of 8 slices shown (8 of 9)]
[im 8/8]
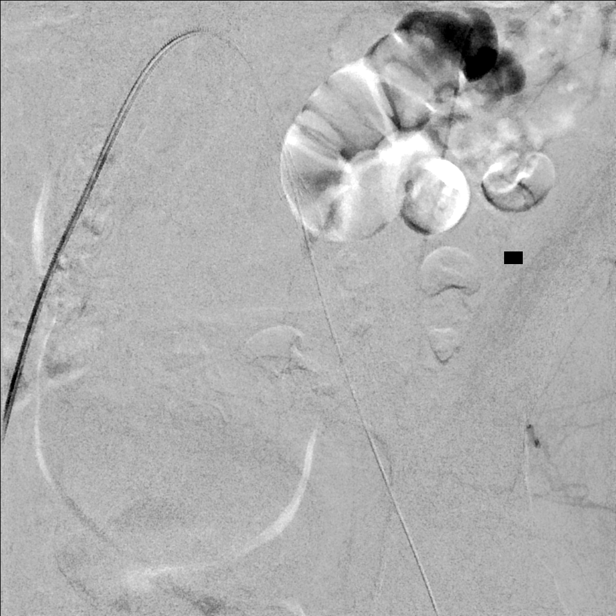

[Series 19: body 4 care · 1 of 9 slices shown (9 of 9)]
[im 9/9]
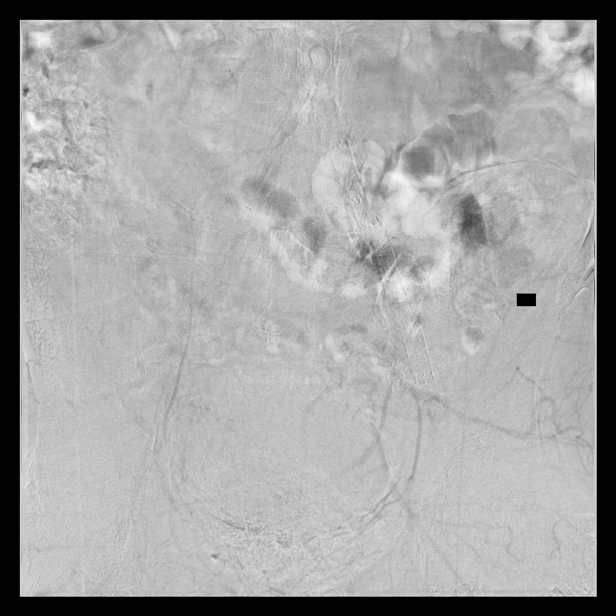

[12 of 24 positions shown; findings below may reference images not displayed]

Right ABI:

Left ABI:

EXAM:
ULTRASOUND GUIDED ACCESS RIGHT COMMON FEMORAL ARTERY

PELVIC ANGIOGRAM

TREATMENT OF LEFT ILIAC ARTERY TANDEM STENOSIS WITH BALLOON
ANGIOPLASTY AND BARE METAL STENTING

ANGIO-SEAL FOR CLOSURE

MEDICATIONS:
None

ANESTHESIA/SEDATION:
Moderate (conscious) sedation was employed during this procedure. A
total of Versed 1.0 mg and Fentanyl 50 mcg was administered
intravenously.

Moderate Sedation Time: 55 minutes. The patient's level of
consciousness and vital signs were monitored continuously by
radiology nursing throughout the procedure under my direct
supervision.

CONTRAST:  80 cc IV contrast

FLUOROSCOPY TIME:  Fluoroscopy Time: 10 minutes 54 seconds (180
mGy).

COMPLICATIONS:
None



Ultrasound survey of the right inguinal region was performed with
images stored and sent to PACs, confirming patency of the vessel.

1% lidocaine was used for local anesthesia. Small stab incision was
made. Blunt dissection was performed under ultrasound guidance to
the common femoral artery. A micropuncture needle was used access
the right common femoral artery under ultrasound. With excellent
arterial blood flow returned, and an .018 micro wire was passed
through the needle, observed enter the abdominal aorta under
fluoroscopy. The needle was removed, and a micropuncture sheath was
placed over the wire. The inner dilator and wire were removed, and
an 035 Bentson wire was advanced under fluoroscopy into the
abdominal aorta. The sheath was removed and a standard 5 French
vascular sheath was placed. The dilator was removed and the sheath
was flushed.

Omni Flush catheter was passed on the Bentson wire to the pelvic
aorta.

Angiogram was performed.

Bentson wire was then used to navigate the Omni Flush catheter over
the bifurcation. Bentson wire was removed and a roadrunner wire was
then used to navigate to the distal external iliac artery. Omni
Flush catheter was passed to the distal external iliac artery and a
rose in wire was placed.

Six French to [HOSPITAL] 55 cm sheath was placed on the Rose an wire to
the distal external iliac artery. Wire was removed and then the
Bentson wire and Kumpe the catheter were used to navigate the wire
into the proximal SFA. With a proximal position of the SFA, the
Kumpe the catheter was then used to deliver a rose in wire into the
SFA.

The sheath was then withdrawn to the common iliac artery and a
target angiogram was performed of the left iliac system.

Measurements were made.

We elected to place an initial 8 mm x 80 mm bare metal stent which
was deployed distally. We then deployed in overlapping 9 mm by 30 mm
stent in the common iliac artery.

Balloon angioplasty was then performed with 8 mm x 40 mm standard
balloon angioplasty.

Repeat angiogram was performed.

Angio-Seal was then deployed for hemostasis.

Sterile bandage was placed.

Patient tolerated the procedure well and remained hemodynamically
stable throughout.

No complications were encountered and no significant blood loss.
FINDINGS: Pelvic angiogram demonstrates minimal atherosclerosis of the distal
aorta with patency of the median sacral artery.

No significant stenosis of the right iliac system with patency of
the hypogastric artery maintained. Mild atherosclerosis of the left
gastric artery with patency of the gluteal artery.

No significant stenosis of the right common femoral artery.

Greater than 80% stenosis of tandem lesions of the left common iliac
artery with irregular plaque, as well as the mid external iliac
artery with irregular plaque.

Attenuated flow through the left hypogastric artery, though remains
patent with moderate to advanced atherosclerosis. The gluteal
arteries remain patent on the left, before and after treatment.

Status post treatment with balloon angioplasty and stenting there is
0% residual stenosis of the left iliac system.
IMPRESSION: Ultrasound-guided access right common femoral artery for pelvic
angiogram and treatment of tandem stenoses of the left iliac system
with bare metal stenting and balloon angioplasty, with no residual
stenosis.

Angio-Seal for hemostasis.

## 2022-05-27 ENCOUNTER — Other Ambulatory Visit: Payer: Self-pay | Admitting: *Deleted

## 2022-05-27 DIAGNOSIS — I6529 Occlusion and stenosis of unspecified carotid artery: Secondary | ICD-10-CM

## 2022-06-02 ENCOUNTER — Ambulatory Visit (INDEPENDENT_AMBULATORY_CARE_PROVIDER_SITE_OTHER): Payer: Self-pay | Admitting: Vascular Surgery

## 2022-06-02 ENCOUNTER — Ambulatory Visit (HOSPITAL_COMMUNITY)
Admission: RE | Admit: 2022-06-02 | Discharge: 2022-06-02 | Disposition: A | Discharge status: Home / Self Care | Payer: Self-pay | Source: Ambulatory Visit | Attending: Vascular Surgery | Admitting: Vascular Surgery

## 2022-06-02 ENCOUNTER — Encounter: Payer: Self-pay | Admitting: Vascular Surgery

## 2022-06-02 VITALS — BP 147/66 | HR 67 | Temp 98.4°F | Resp 20 | Ht 70.0 in | Wt 156.0 lb

## 2022-06-02 DIAGNOSIS — I6522 Occlusion and stenosis of left carotid artery: Secondary | ICD-10-CM

## 2022-06-02 DIAGNOSIS — I6529 Occlusion and stenosis of unspecified carotid artery: Secondary | ICD-10-CM | POA: Insufficient documentation

## 2022-06-02 NOTE — Progress Notes (Signed)
Patient ID: William Shields, male   DOB: 05-06-1956, 66 y.o.   MRN: [id number]  Reason for Consult: New Patient (Initial Visit)   Referred by Jeanie Sewer, NP  Subjective:     HPI:  William Shields is a 66 y.o. male with diabetes and hypertension history of left iliac artery stenting with interventional radiology a few years ago.  He does take clopidogrel states that he is unable to take any statin drugs.  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  Denies any history of stroke, TIA or amaurosis.  He continues to be an every day smoker.  Past Medical History:  Diagnosis Date   Diabetes mellitus without complication (HCC)    Hypertension    PAD (peripheral artery disease) (HCC)    Family History  Problem Relation Age of Onset   Coronary artery disease Father    Microcephaly Father    Peripheral Artery Disease Sister    Hyperlipidemia Paternal Grandfather    CAD Paternal Grandfather    Past Surgical History:  Procedure Laterality Date   IR ANGIOGRAM PELVIS SELECTIVE OR SUPRASELECTIVE  11/06/2019   IR ILIAC ART STENT INC PTA MOD SED  11/06/2019   IR RADIOLOGIST EVAL & MGMT  03/02/2018   IR US GUIDE VASC ACCESS RIGHT  11/06/2019    Short Social History:  Social History   Tobacco Use   Smoking status: Every Day    Packs/day: 0.50    Years: 40.00    Total pack years: 20.00    Types: Cigarettes   Smokeless tobacco: Never  Substance Use Topics   Alcohol use: Yes    Comment: occs.    Allergies  Allergen Reactions   Pollen Extract Other (See Comments)    Sneezing, coughing    Current Outpatient Medications  Medication Sig Dispense Refill   B Complex-C (SUPER B COMPLEX PO) Take 1 tablet by mouth at bedtime.     Cholecalciferol (VITAMIN D-3) 125 MCG (5000 UT) TABS Take 5,000 Units by mouth daily.     Coenzyme Q10 (COQ-10) 100 MG CAPS Take 100 mg by mouth daily.     diphenhydrAMINE  (BENADRYL) 25 MG tablet Take 25 mg by mouth daily as needed (congestion).     glipiZIDE (GLUCOTROL XL) 5 MG 24 hr tablet Take 5 mg by mouth daily as needed (with meals that will increase blood sugar levels).      losartan (COZAAR) 100 MG tablet Take 100 mg by mouth daily.     losartan (COZAAR) 25 MG tablet Take 25 mg by mouth daily.     metFORMIN (GLUCOPHAGE-XR) 500 MG 24 hr tablet Take 500 mg by mouth 2 (two) times daily with a meal.   5   Multiple Vitamin (MULTIVITAMIN WITH MINERALS) TABS tablet Take 1 tablet by mouth once a week.     oxymetazoline (AFRIN) 0.05 % nasal spray Place 1 spray into both nostrils 2 (two) times daily as needed for congestion.     tamsulosin (FLOMAX) 0.4 MG CAPS capsule Take 0.4 mg by mouth daily after lunch.      vitamin E (VITAMIN E) 180 MG (400 UNITS) capsule Take 400 Units by mouth 4 (four) times a week.     No current facility-administered medications for this visit.    Review of Systems  Constitutional:  Constitutional negative. HENT: HENT negative.  Eyes: Eyes negative.  Respiratory: Respiratory negative.  Cardiovascular: Cardiovascular  negative.  GI: Gastrointestinal negative.  Musculoskeletal: Musculoskeletal negative.  Skin: Skin negative.  Neurological: Neurological negative. Hematologic: Hematologic/lymphatic negative.  Psychiatric: Positive for depressed mood.        Objective:  Objective   Vitals:   06/02/22 1021 06/02/22 1023  BP: (!) 157/76 (!) 147/66  Pulse: 67   Resp: 20   Temp: 98.4 F (36.9 C)   SpO2: 98%   Weight: 156 lb (70.8 kg)   Height: '5\' 10"'$  (1.778 m)    Body mass index is 22.38 kg/m.  Physical Exam HENT:     Head: Normocephalic.     Nose: Nose normal.  Eyes:     Pupils: Pupils are equal, round, and reactive to light.  Neck:     Vascular: No carotid bruit.  Cardiovascular:     Rate and Rhythm: Normal rate.     Pulses:          Popliteal pulses are 0 on the right side and 2+ on the left side.        Posterior tibial pulses are 0 on the right side and 1+ on the left side.  Pulmonary:     Effort: Pulmonary effort is normal.  Abdominal:     General: Abdomen is flat.     Palpations: Abdomen is soft. There is no mass.  Skin:    General: Skin is warm and dry.     Capillary Refill: Capillary refill takes less than 2 seconds.  Neurological:     General: No focal deficit present.     Mental Status: He is alert.  Psychiatric:        Mood and Affect: Mood normal.     Data: Right Carotid Findings:  +----------+--------+--------+--------+------------------+--------+            PSV cm/sEDV cm/sStenosisPlaque DescriptionComments  +----------+--------+--------+--------+------------------+--------+  CCA Prox  113                                                 +----------+--------+--------+--------+------------------+--------+  CCA Mid   124                     heterogenous                +----------+--------+--------+--------+------------------+--------+  CCA Distal90                      heterogenous                +----------+--------+--------+--------+------------------+--------+  ICA Prox                  Occluded                            +----------+--------+--------+--------+------------------+--------+  ICA Mid                   Occluded                            +----------+--------+--------+--------+------------------+--------+  ICA Distal                Occluded                            +----------+--------+--------+--------+------------------+--------+  ECA  178     10                                          +----------+--------+--------+--------+------------------+--------+   +----------+--------+-------+----------------+-------------------+            PSV cm/sEDV cmsDescribe        Arm Pressure (mmHG)  +----------+--------+-------+----------------+-------------------+  [id number]             Multiphasic, WNL                     +----------+--------+-------+----------------+-------------------+   +---------+--------+--+--------+--+---------+  VertebralPSV cm/s89EDV cm/s25Antegrade  +---------+--------+--+--------+--+---------+       Left Carotid Findings:  +----------+--------+--------+--------+------------------+--------+            PSV cm/sEDV cm/sStenosisPlaque DescriptionComments  +----------+--------+--------+--------+------------------+--------+  CCA Prox  157     31                                          +----------+--------+--------+--------+------------------+--------+  CCA Mid   100     29              heterogenous                +----------+--------+--------+--------+------------------+--------+  CCA Distal80      22              heterogenous                +----------+--------+--------+--------+------------------+--------+  ICA Prox  140     43      40-59%  heterogenous                +----------+--------+--------+--------+------------------+--------+  ICA Mid   142     47      40-59%                              +----------+--------+--------+--------+------------------+--------+  ICA Distal161     39                                          +----------+--------+--------+--------+------------------+--------+  ECA       137     21                                          +----------+--------+--------+--------+------------------+--------+   +----------+--------+--------+----------------+-------------------+            PSV cm/sEDV cm/sDescribe        Arm Pressure (mmHG)  +----------+--------+--------+----------------+-------------------+  [id number]             Multiphasic, WNL                     +----------+--------+--------+----------------+-------------------+   +---------+--------+---+--------+--+---------+  VertebralPSV cm/s111EDV cm/s24Antegrade   +---------+--------+---+--------+--+---------+           Summary:  Right Carotid: Evidence consistent with a total occlusion of the right  ICA.   Left Carotid: Velocities in the left ICA are consistent with a 40-59%  stenosis.   Vertebrals:  Bilateral vertebral arteries demonstrate antegrade flow.  Subclavians: Normal  flow hemodynamics were seen in bilateral subclavian               arteries.      Assessment/Plan:    66 year old male with carotid bruit that was identified on physical exam found to have an occluded right carotid I do not appreciate any bruits on today's exam.  He does have 40 to 59% stenosis of the left ICA this is likely over estimated given the contralateral occlusion.  No history of stroke.  To have occluded the carotid on the right silently.  He is at very low risk of any neurologic side effects from his mild to moderate stenosis of the carotid at this time and I have not recommended any intervention and this should not prevent him from any form of work from a vascular standpoint.  He will follow-up in 1 year with carotid duplex and I have urged him to reengage interventional radiology for follow-up of his iliac artery stent as well.     Waynetta Sandy MD [facility]

## 2023-09-21 ENCOUNTER — Telehealth: Payer: Self-pay

## 2023-09-21 NOTE — Telephone Encounter (Signed)
 Brie, RN @ [facility] called asking for an appt to be pushed up because pt was in the office today and there was a concern that he had new LLE claudication with a hx of iliac stent.   On review of chart, Pt had iliac stents placed by IR and not [facility].  Here at [facility] we are following his carotid's and the f/u appt he has scheduled is a 1-yr for that, not his LE's When speaking to the patient, he states that the surgeon is only at [place] 1 or 2 times a month and he would just assume having one person do both problems and that's why his dr called up.   Pt reports his symptoms as reddened foot and toes, severe cramping when he is walking that feels like a charlie horse, making him stop.  Rennie Plowman FP that a referral was needed to evaluate LE's per protocol. Currently waiting on referral to schedule.  Called pt to confirm understanding

## 2023-09-26 NOTE — Telephone Encounter (Signed)
Brie, RN @ [facility] called asking for an appt to be pushed up because pt was in the office today and there was a concern that he had new LLE claudication with a hx of iliac stent.   On review of chart, Pt had iliac stents placed by IR and not [facility].  Here at [facility] we are following his carotid's and the f/u appt he has scheduled is a 1-yr for that, not his LE's When speaking to the patient, he states that the surgeon is only at [place] 1 or 2 times a month and he would just assume having one person do both problems and that's why his dr called up.   Pt reports his symptoms as reddened foot and toes, severe cramping when he is walking that feels like a charlie horse, making him stop.  Rennie Plowman FP that a referral was needed to evaluate LE's per protocol. Currently waiting on referral to schedule.  Called pt to confirm understanding

## 2023-10-05 ENCOUNTER — Other Ambulatory Visit: Payer: Self-pay | Admitting: *Deleted

## 2023-10-05 DIAGNOSIS — I739 Peripheral vascular disease, unspecified: Secondary | ICD-10-CM

## 2023-10-05 DIAGNOSIS — I6529 Occlusion and stenosis of unspecified carotid artery: Secondary | ICD-10-CM

## 2023-10-06 ENCOUNTER — Ambulatory Visit (INDEPENDENT_AMBULATORY_CARE_PROVIDER_SITE_OTHER): Payer: Medicare Other | Admitting: Physician Assistant

## 2023-10-06 ENCOUNTER — Other Ambulatory Visit: Payer: Self-pay | Admitting: Vascular Surgery

## 2023-10-06 ENCOUNTER — Ambulatory Visit (INDEPENDENT_AMBULATORY_CARE_PROVIDER_SITE_OTHER)
Admission: RE | Admit: 2023-10-06 | Discharge: 2023-10-06 | Disposition: A | Discharge status: Home / Self Care | Payer: Medicare Other | Source: Ambulatory Visit | Attending: Vascular Surgery | Admitting: Vascular Surgery

## 2023-10-06 ENCOUNTER — Other Ambulatory Visit (HOSPITAL_COMMUNITY): Payer: Self-pay | Admitting: Vascular Surgery

## 2023-10-06 ENCOUNTER — Ambulatory Visit (HOSPITAL_COMMUNITY)
Admission: RE | Admit: 2023-10-06 | Discharge: 2023-10-06 | Disposition: A | Discharge status: Home / Self Care | Payer: Medicare Other | Source: Ambulatory Visit | Attending: Vascular Surgery | Admitting: Vascular Surgery

## 2023-10-06 VITALS — BP 171/63 | HR 49 | Temp 98.0°F | Resp 18 | Ht 70.0 in | Wt 156.0 lb

## 2023-10-06 DIAGNOSIS — I70222 Atherosclerosis of native arteries of extremities with rest pain, left leg: Secondary | ICD-10-CM | POA: Diagnosis not present

## 2023-10-06 DIAGNOSIS — I739 Peripheral vascular disease, unspecified: Secondary | ICD-10-CM | POA: Diagnosis present

## 2023-10-06 DIAGNOSIS — I745 Embolism and thrombosis of iliac artery: Secondary | ICD-10-CM | POA: Diagnosis not present

## 2023-10-06 DIAGNOSIS — I6529 Occlusion and stenosis of unspecified carotid artery: Secondary | ICD-10-CM | POA: Diagnosis present

## 2023-10-06 DIAGNOSIS — I70223 Atherosclerosis of native arteries of extremities with rest pain, bilateral legs: Secondary | ICD-10-CM

## 2023-10-06 DIAGNOSIS — I6521 Occlusion and stenosis of right carotid artery: Secondary | ICD-10-CM

## 2023-10-06 LAB — VAS US ABI WITH/WO TBI
Left ABI: 0.28
Right ABI: 0.49

## 2023-10-06 NOTE — Progress Notes (Signed)
Office Note     CC:  follow up Requesting Provider:  Mikki Santee Key, *  HPI: William Shields is a 68 y.o. (October 28, 1955) male who presents as work in at request of the vascular lab because of acute symptoms and findings on his non invasive studies. He explains that since before Thanksgiving he has had progressive LLE> RLE pain.  He describes it as tightness and cramping on limited ambulation. Over the last several weeks he said he developed significant swelling in the left leg followed by numbness and discoloration of his toes on left foot. The numbness is in the foot however if he lays on the left leg at night the entire left leg goes numb. He will have to sit on side of bed and shake it out for several minutes before he is able to get up. He says he has put off getting it evaluated because he has been dealing with a lot of personal issues over the holidays but now the leg is getting very unbearable. He has history of left iliac artery stenting with IR in 2021. He says he has never had follow up with them.   He is not on Aspirin or Plavix any longer. He explains that he was told he did not have any refills and so he assumed that he no longer needed to take the Plavix. He will occasionally take a 1/2 of a full dose Aspirin if he has any chest discomfort. He cannot tolerate statins. He is medically managed for HTN and Diabetes.  He also is followed for carotid artery stenosis. He has known right ICA occlusion, left with 40-59% stenosis on duplex. He denies any amaurosis fugax or other visual changes, slurred speech, facial drooping or unilateral upper or lower extremity weakness or numbness. He is a current daily smoker, 1/2 ppd  Past Medical History:  Diagnosis Date   Carotid artery occlusion    Diabetes mellitus without complication (HCC)    Hypertension    PAD (peripheral artery disease) (HCC)    Peripheral arterial disease (HCC)     Past Surgical History:  Procedure Laterality Date   IR  ANGIOGRAM PELVIS SELECTIVE OR SUPRASELECTIVE  11/06/2019   IR ILIAC ART STENT INC PTA MOD SED  11/06/2019   IR RADIOLOGIST EVAL & MGMT  03/02/2018   IR US GUIDE VASC ACCESS RIGHT  11/06/2019    Social History   Socioeconomic History   Marital status: Married    Spouse name: Not on file   Number of children: Not on file   Years of education: Not on file   Highest education level: Not on file  Occupational History   Occupation: W  Tobacco Use   Smoking status: Every Day    Current packs/day: 0.50    Average packs/day: 0.5 packs/day for 40.0 years (20.0 ttl pk-yrs)    Types: Cigarettes   Smokeless tobacco: Never  Vaping Use   Vaping status: Never Used  Substance and Sexual Activity   Alcohol use: Yes    Comment: occs.   Drug use: Never   Sexual activity: Not on file  Other Topics Concern   Not on file  Social History Narrative   Not on file   Social Drivers of Health   Financial Resource Strain: Not on file  Food Insecurity: Not on file  Transportation Needs: Not on file  Physical Activity: Not on file  Stress: Not on file  Social Connections: Not on file  Intimate Partner Violence: Not on file  Family History  Problem Relation Age of Onset   Coronary artery disease Father    Microcephaly Father    Peripheral Artery Disease Sister    Hyperlipidemia Paternal Grandfather    CAD Paternal Grandfather     Current Outpatient Medications  Medication Sig Dispense Refill   B Complex-C (SUPER B COMPLEX PO) Take 1 tablet by mouth at bedtime.     Cholecalciferol (VITAMIN D-3) 125 MCG (5000 UT) TABS Take 5,000 Units by mouth daily.     Coenzyme Q10 (COQ-10) 100 MG CAPS Take 100 mg by mouth daily.     diphenhydrAMINE (BENADRYL) 25 MG tablet Take 25 mg by mouth daily as needed (congestion).     glipiZIDE (GLUCOTROL XL) 5 MG 24 hr tablet Take 5 mg by mouth daily as needed (with meals that will increase blood sugar levels).      losartan (COZAAR) 100 MG tablet Take 100 mg by mouth  daily.     losartan (COZAAR) 25 MG tablet Take 25 mg by mouth daily.     metFORMIN (GLUCOPHAGE-XR) 500 MG 24 hr tablet Take 500 mg by mouth 2 (two) times daily with a meal.   5   Multiple Vitamin (MULTIVITAMIN WITH MINERALS) TABS tablet Take 1 tablet by mouth once a week.     oxymetazoline (AFRIN) 0.05 % nasal spray Place 1 spray into both nostrils 2 (two) times daily as needed for congestion.     tamsulosin (FLOMAX) 0.4 MG CAPS capsule Take 0.4 mg by mouth daily after lunch.      vitamin E (VITAMIN E) 180 MG (400 UNITS) capsule Take 400 Units by mouth 4 (four) times a week.     No current facility-administered medications for this visit.    Allergies  Allergen Reactions   Pollen Extract Other (See Comments)    Sneezing, coughing     REVIEW OF SYSTEMS:  [X]  denotes positive finding, [ ]  denotes negative finding Cardiac  Comments:  Chest pain or chest pressure:    Shortness of breath upon exertion:    Short of breath when lying flat:    Irregular heart rhythm:        Vascular    Pain in calf, thigh, or hip brought on by ambulation:    Pain in feet at night that wakes you up from your sleep:     Blood clot in your veins:    Leg swelling:         Pulmonary    Oxygen at home:    Productive cough:     Wheezing:         Neurologic    Sudden weakness in arms or legs:     Sudden numbness in arms or legs:     Sudden onset of difficulty speaking or slurred speech:    Temporary loss of vision in one eye:     Problems with dizziness:         Gastrointestinal    Blood in stool:     Vomited blood:         Genitourinary    Burning when urinating:     Blood in urine:        Psychiatric    Major depression:         Hematologic    Bleeding problems:    Problems with blood clotting too easily:        Skin    Rashes or ulcers:        Constitutional  Fever or chills:      PHYSICAL EXAMINATION:  Vitals:   10/06/23 0940  BP: (!) 171/63  Pulse: (!) 49  Resp: 18   Temp: 98 F (36.7 C)  TempSrc: Temporal  SpO2: 97%  Weight: 156 lb (70.8 kg)  Height: 5\' 10"  (1.778 m)    General:  WDWN in NAD; vital signs documented above Gait: Normal HENT: WNL, normocephalic Pulmonary: normal non-labored breathing without  wheezing Cardiac: regular HR Abdomen: soft Vascular Exam/Pulses: 2+ right femoral pulse, no left femoral pulse, Right Doppler Dp and PT signal, very faint left PT signal. Extremities: with ischemic changes to left 1st and 2nd toes, without Gangrene , without cellulitis; without open wounds; toes cool on left foot. Motor and sensation intact Musculoskeletal: no muscle wasting or atrophy  Neurologic: A&O X 3 Psychiatric:  The pt has Normal affect.   Non-Invasive Vascular Imaging:   +-------+-----------+-----------+------------+------------+  ABI/TBIToday's ABIToday's TBIPrevious ABIPrevious TBI  +-------+-----------+-----------+------------+------------+  Right 0.49       0.35       1.03        0.65          +-------+-----------+-----------+------------+------------+  Left  0.28       0          0.68        0.29          +-------+-----------+-----------+------------+------------+   VAS US Aorta/IVC/Iliacs: Summary:  - Patent abodminal aorta with no evidence of stenosis or aneurysm.  - <50% stenosis of the right common iliac artery.  - >50% stenosis of the right external iliac artery.  - Occluded left common/external iliac artery stent.  - Occluded left external iliac artery post stent with reconstitution at distal external iliac artery/common femoral artery.    VAS US Carotid:  Summary:  Right Carotid: Evidence consistent with a total occlusion of the right ICA.   Left Carotid: Velocities in the left ICA are consistent with a 40- 59% stenosis.   Vertebrals: Bilateral vertebral arteries demonstrate antegrade flow.   No significant change compared to prior exam 06/02/2022     ASSESSMENT/PLAN:: 68 y.o. male  presents as work in at request of the vascular lab because of acute symptoms and findings on his non invasive studies. He explains that since before Thanksgiving he has had progressive LLE> RLE pain.  He is now having numbness in his left foot at rest, lifestyle limiting claudication and ischemic changes to his left toes. Duplex today shows occluded left CIA/EIA stent. ABI with significantly decreased signals L> R with absence of left toe pressure. I am unable to palpable left femoral pulse. He is motor and sensation intact. His carotid duplex is stable today. Right ICA occlusion known. Left 40-59% unchanged.He is neurologically intact with no associated symptoms - recommend Aspirin 81 mg daily - IR placed his left CIA artery stent so he was previously advised to follow up with them for surveillance but has not done so. I have asked my office to call over to IR to get him a STAT appointment for evaluation of his left iliac stent occlusion and CLI with rest pain. Sent him up for CTA abd/pelvis with runoff to try to expedite evaluation - He will need carotid duplex repeated in 1 year   Graceann Congress, PA-C [facility] [phone]  Clinic MD:   Karin Lieu

## 2023-10-07 ENCOUNTER — Ambulatory Visit
Admission: RE | Admit: 2023-10-07 | Discharge: 2023-10-07 | Disposition: A | Discharge status: Home / Self Care | Payer: Medicare Other | Source: Ambulatory Visit | Attending: Vascular Surgery | Admitting: Vascular Surgery

## 2023-10-07 ENCOUNTER — Ambulatory Visit
Admission: RE | Admit: 2023-10-07 | Discharge: 2023-10-07 | Disposition: A | Discharge status: Home / Self Care | Payer: Medicare Other | Source: Ambulatory Visit | Attending: Vascular Surgery

## 2023-10-07 ENCOUNTER — Other Ambulatory Visit: Payer: Medicare Other

## 2023-10-07 DIAGNOSIS — I70223 Atherosclerosis of native arteries of extremities with rest pain, bilateral legs: Secondary | ICD-10-CM

## 2023-10-07 HISTORY — PX: IR RADIOLOGIST EVAL & MGMT: IMG5224

## 2023-10-07 MED ORDER — IOPAMIDOL (ISOVUE-370) INJECTION 76%
500.0000 mL | Freq: Once | INTRAVENOUS | Status: AC | PRN
  Administered 2023-10-07: 120 mL via INTRAVENOUS

## 2023-10-07 NOTE — Consult Note (Signed)
Chief Complaint: Left leg pain  Referring Physician(s): Theadora Rama, Corrina  Supervising Physician: Gilmer Mor  Patient Status: [facility] - Out-pt  History of Present Illness: William Shields is a 68 y.o. male with known PAD, previously treated for bilateral iliac disease, including left CIA/EIA stenting, now with worsening symptoms.    William Shields is known to our service from [facility], where I first treated him July 2019 for R>L lower extremity lifestyle limiting claudication. He then had interval presentation to [facility] 11/03/19 in the eD for left blue toe syndrome.  We treated him then with left iliac stenting of the CIA and EIA.  (8mm EIA, with 9mm CIA).  He did not follow up with [place] in the clinic at [facility] after that.   He tells me today that about 2-3 weeks ago he began having worsening left calf and thigh cramping while working and walking.  Particularly around [employer].  The pain does not completely resolve with rest, as he tells me that he has associated "numbness" and "pain" in the foot.  He denies any symptom of the right.  He denies any wounds.   He says he takes aspirin "sometimes".  He is not taking statin medication.  He is smoking again, after previously quitting.  He endorses 1ppd.  He takes BP medication, and DM medication.   He denies recent hospitalization.  He denies any known stroke, MI, or chest pain with activity.    Non invasive 10/06/23: Right ABI: 0.49 Right TBI:0.35 Left ABI: 0.28 Left TBI: absent Duplex performed of the aorto-iliac segment shows a left iliac occlusion, with monphasic CFA waveform.   CTA performed today confirms left iliac occlusion.  There is a stump at the top of the CIA. The EIA is patent at the inguinal ligament, and the CFA is patent. He has right iliac disease and bilateral fem-pop disease.   CTA 10/07/23    Past Medical History:  Diagnosis Date   Carotid artery occlusion    Diabetes mellitus without complication (HCC)    Hypertension     PAD (peripheral artery disease) (HCC)    Peripheral arterial disease (HCC)     Past Surgical History:  Procedure Laterality Date   IR ANGIOGRAM PELVIS SELECTIVE OR SUPRASELECTIVE  11/06/2019   IR ILIAC ART STENT INC PTA MOD SED  11/06/2019   IR RADIOLOGIST EVAL & MGMT  03/02/2018   IR US GUIDE VASC ACCESS RIGHT  11/06/2019    Allergies: Pollen extract  Medications: Prior to Admission medications   Medication Sig Start Date End Date Taking? Authorizing Provider  B Complex-C (SUPER B COMPLEX PO) Take 1 tablet by mouth at bedtime.    [provider]  Cholecalciferol (VITAMIN D-3) 125 MCG (5000 UT) TABS Take 5,000 Units by mouth daily.    [provider]  Coenzyme Q10 (COQ-10) 100 MG CAPS Take 100 mg by mouth daily.    [provider]  diphenhydrAMINE (BENADRYL) 25 MG tablet Take 25 mg by mouth daily as needed (congestion).    [provider]  glipiZIDE (GLUCOTROL XL) 5 MG 24 hr tablet Take 5 mg by mouth daily as needed (with meals that will increase blood sugar levels).     [provider]  losartan (COZAAR) 100 MG tablet Take 100 mg by mouth daily. 05/21/22   [provider]  losartan (COZAAR) 25 MG tablet Take 25 mg by mouth daily. 10/16/19   [provider]  metFORMIN (GLUCOPHAGE-XR) 500 MG 24 hr tablet Take 500  mg by mouth 2 (two) times daily with a meal.  01/28/18   [provider]  Multiple Vitamin (MULTIVITAMIN WITH MINERALS) TABS tablet Take 1 tablet by mouth once a week.    [provider]  oxymetazoline (AFRIN) 0.05 % nasal spray Place 1 spray into both nostrils 2 (two) times daily as needed for congestion.    [provider]  tamsulosin (FLOMAX) 0.4 MG CAPS capsule Take 0.4 mg by mouth daily after lunch.  01/19/18   [provider]  vitamin E (VITAMIN E) 180 MG (400 UNITS) capsule Take 400 Units by mouth 4 (four) times a week.    [provider]     Family History  Problem Relation  Age of Onset   Coronary artery disease Father    Microcephaly Father    Peripheral Artery Disease Sister    Hyperlipidemia Paternal Grandfather    CAD Paternal Grandfather     Social History   Socioeconomic History   Marital status: Married    Spouse name: Not on file   Number of children: Not on file   Years of education: Not on file   Highest education level: Not on file  Occupational History   Occupation: W  Tobacco Use   Smoking status: Every Day    Current packs/day: 0.50    Average packs/day: 0.5 packs/day for 40.0 years (20.0 ttl pk-yrs)    Types: Cigarettes   Smokeless tobacco: Never  Vaping Use   Vaping status: Never Used  Substance and Sexual Activity   Alcohol use: Yes    Comment: occs.   Drug use: Never   Sexual activity: Not on file  Other Topics Concern   Not on file  Social History Narrative   Not on file   Social Drivers of Health   Financial Resource Strain: Not on file  Food Insecurity: Not on file  Transportation Needs: Not on file  Physical Activity: Not on file  Stress: Not on file  Social Connections: Not on file       Review of Systems: A 12 point ROS discussed and pertinent positives are indicated in the HPI above.  All other systems are negative.  Review of Systems  Vital Signs: There were no vitals taken for this visit.    Physical Exam General: 68 yo male appearing stated age.  Well-developed, well-nourished.  No distress. HEENT: Atraumatic, normocephalic.  Conjugate gaze, extra-ocular motor intact. No scleral icterus or scleral injection. No lesions on external ears, nose, lips, or gums.  Oral mucosa moist, pink.  Neck: Symmetric with no goiter enlargement.  Chest/Lungs:  Symmetric chest with inspiration/expiration.  No labored breathing.    Heart:   No JVD appreciated.  Abdomen:  Soft, NT/ND, with + bowel sounds.   Genito-urinary: Deferred Neurologic: Alert & Oriented to person, place, and time.   Normal affect and insight.   Appropriate questions.  Moving all 4 extremities with gross sensory intact.  Pulse Exam:    Palpable right CFA.  Non-palp left CFA.  Palpable right radial artery & left radial artery.  Palpable right PT. Non palpable left PT and DP.   + doppler of the left PT Extremities: No wound.  LE are both warm to the ankle.  Both feet are symmetric cool.  Livedo skin mottling of the left foot compared to right  Imaging: VAS US CAROTID Result Date: 10/06/2023 Carotid Arterial Duplex Study Patient Name:  William Shields  Date of Exam:   10/06/2023 Medical Rec #:  [id number]     Accession #:    [id number] Date of Birth: Jun 29, 1956     Patient Gender: M Patient Age:   33 years Exam Location:  [facility] Procedure:      VAS US CAROTID Referring Phys: Lemar Livings --------------------------------------------------------------------------------  Indications:       Carotid artery disease and Right Carotid Occlusion. Risk Factors:      Hypertension, Diabetes, current smoker. Comparison Study:  06/02/2022                    Right Carotid: Evidence consistent with a total occlusion of                    the right ICA.                     Left Carotid: Velocities in the left ICA are consistent with                    a 40- 59% stenosis.                     Vertebrals: Bilateral vertebral arteries demonstrate                    antegrade flow.                     Subclavians: Normal flow hemodynamics were seen in bilateral                    subclavian arteries. Performing Technologist: Criss Rosales RVT  Examination Guidelines: A complete evaluation includes B-mode imaging, spectral Doppler, color Doppler, and power Doppler as needed of all accessible portions of each vessel. Bilateral testing is considered an integral part of a complete examination. Limited examinations for reoccurring indications may be performed as noted.  Right Carotid Findings: +----------+--------+--------+--------+------------------+--------+            PSV cm/sEDV cm/sStenosisPlaque DescriptionComments +----------+--------+--------+--------+------------------+--------+ CCA Prox  118                                                +----------+--------+--------+--------+------------------+--------+ CCA Mid   78      0               heterogenous               +----------+--------+--------+--------+------------------+--------+ CCA Distal110     0               heterogenous               +----------+--------+--------+--------+------------------+--------+ ICA Prox  0       0       Occluded                           +----------+--------+--------+--------+------------------+--------+ ICA Mid   0       0       Occluded                           +----------+--------+--------+--------+------------------+--------+ ICA Distal0       0       Occluded                           +----------+--------+--------+--------+------------------+--------+  ECA       152                                                +----------+--------+--------+--------+------------------+--------+ +----------+--------+-------+--------+-------------------+           PSV cm/sEDV cmsDescribeArm Pressure (mmHG) +----------+--------+-------+--------+-------------------+ Subclavian                       191                 +----------+--------+-------+--------+-------------------+ +---------+--------+---+--------+--+---------+ VertebralPSV cm/s206EDV cm/s28Antegrade +---------+--------+---+--------+--+---------+  Left Carotid Findings: +----------+--------+--------+--------+------------------+--------+           PSV cm/sEDV cm/sStenosisPlaque DescriptionComments +----------+--------+--------+--------+------------------+--------+ CCA Prox  171     22                                         +----------+--------+--------+--------+------------------+--------+ CCA Mid   125     0                                           +----------+--------+--------+--------+------------------+--------+ CCA Distal117     24              heterogenous               +----------+--------+--------+--------+------------------+--------+ ICA Prox  184     47      40-59%  heterogenous               +----------+--------+--------+--------+------------------+--------+ ICA Mid   209     41      40-59%  heterogenous               +----------+--------+--------+--------+------------------+--------+ ICA Distal140     16                                         +----------+--------+--------+--------+------------------+--------+ ECA       114     0                                          +----------+--------+--------+--------+------------------+--------+ +----------+--------+--------+--------+-------------------+           PSV cm/sEDV cm/sDescribeArm Pressure (mmHG) +----------+--------+--------+--------+-------------------+ Subclavian                        187                 +----------+--------+--------+--------+-------------------+ +---------+--------+---+--------+--+---------+ VertebralPSV cm/s111EDV cm/s21Antegrade +---------+--------+---+--------+--+---------+  Summary: Right Carotid: Evidence consistent with a total occlusion of the right ICA. Left Carotid: Velocities in the left ICA are consistent with a 40- 59% stenosis. Vertebrals: Bilateral vertebral arteries demonstrate antegrade flow. No significant change compared to prior exam 06/02/2022  Left Graft(s):  Vertebrals: Subclavians: *See table(s) above for measurements and observations.  Electronically signed by Gerarda Fraction on 10/06/2023 at 5:01:25 PM.    Final    VAS US AORTA/IVC/ILIACS Result Date: 10/06/2023 ABDOMINAL AORTA STUDY Patient Name:  William Shields  Date  of Exam:   10/06/2023 Medical Rec #: [id number]     Accession #:    [id number] Date of Birth: 1956-03-07     Patient Gender: M Patient Age:   29 years Exam Location:  [facility] Procedure:      VAS US AORTA/IVC/ILIACS Referring Phys: Lemar Livings --------------------------------------------------------------------------------  Indications: Leg pain, claudication, rest pain Risk Factors: Hypertension, current smoker. Vascular Interventions: Left Iliac Stenting. Limitations: Air/bowel gas.  Performing Technologist: Criss Rosales RVT  Examination Guidelines: A complete evaluation includes B-mode imaging, spectral Doppler, color Doppler, and power Doppler as needed of all accessible portions of each vessel. Bilateral testing is considered an integral part of a complete examination. Limited examinations for reoccurring indications may be performed as noted.  Abdominal Aorta Findings: +-------------+-------+----------+----------+--------+--------+--------+ Location     AP (cm)Trans (cm)PSV (cm/s)WaveformThrombusComments +-------------+-------+----------+----------+--------+--------+--------+ Proximal     2.10   2.00      52                                 +-------------+-------+----------+----------+--------+--------+--------+ Mid          2.10   2.00      90                                 +-------------+-------+----------+----------+--------+--------+--------+ Distal       1.30   1.40      49                                 +-------------+-------+----------+----------+--------+--------+--------+ RT CIA Prox  0.7    0.8       129                                +-------------+-------+----------+----------+--------+--------+--------+ RT CIA Distal                 144                                +-------------+-------+----------+----------+--------+--------+--------+ RT EIA Prox                   197                                +-------------+-------+----------+----------+--------+--------+--------+ RT EIA Mid                    299                                 +-------------+-------+----------+----------+--------+--------+--------+ RT EIA Distal                 176                                +-------------+-------+----------+----------+--------+--------+--------+ LT CIA Prox  0.7    0.7       0                                  +-------------+-------+----------+----------+--------+--------+--------+  LT CIA Mid                    0                                  +-------------+-------+----------+----------+--------+--------+--------+ LT CIA Distal                 0                                  +-------------+-------+----------+----------+--------+--------+--------+ LT EIA Prox                   0                                  +-------------+-------+----------+----------+--------+--------+--------+ LT EIA Mid                    0                                  +-------------+-------+----------+----------+--------+--------+--------+ LT EIA Distal                 35                                 +-------------+-------+----------+----------+--------+--------+--------+  Summary:  - Patent abodminal aorta with no evidence of stenosis or aneurysm. - <50% stenosis of the right common iliac artery. - >50% stenosis of the right external iliac artery. - Occluded left common/external iliac artery stent. - Occluded left external iliac artery post stent with reconstitution at distal external iliac artery/common femoral artery.  *See table(s) above for measurements and observations.  Electronically signed by Gerarda Fraction on 10/06/2023 at 5:01:12 PM.    Final    VAS [place] ABI WITH/WO TBI Result Date: 10/06/2023  LOWER EXTREMITY DOPPLER STUDY Patient Name:  William Shields  Date of Exam:   10/06/2023 Medical Rec #: [id number]     Accession #:    [id number] Date of Birth: 06/22/1956     Patient Gender: M Patient Age:   49 years Exam Location:  [facility] Procedure:      VAS [place] ABI WITH/WO TBI Referring  Phys: Lemar Livings --------------------------------------------------------------------------------  Indications: Claudication, rest pain, and peripheral artery disease. High Risk Factors: Hypertension, Diabetes, current smoker.  Vascular Interventions: Left Iliac Stenting. Comparison Study: 11/04/2019                   Right: 1.03; 0.65                   Left: 0.68; 0.29 Performing Technologist: Criss Rosales RVT  Examination Guidelines: A complete evaluation includes at minimum, Doppler waveform signals and systolic blood pressure reading at the level of bilateral brachial, anterior tibial, and posterior tibial arteries, when vessel segments are accessible. Bilateral testing is considered an integral part of a complete examination. Photoelectric Plethysmograph (PPG) waveforms and toe systolic pressure readings are included as required and additional duplex testing as needed. Limited examinations for reoccurring indications may be performed as noted.  ABI Findings: +---------+------------------+-----+----------+--------+ Right    Rt Pressure (mmHg)IndexWaveform  Comment  +---------+------------------+-----+----------+--------+  Brachial 191                                       +---------+------------------+-----+----------+--------+ PTA      93                0.49 monophasic         +---------+------------------+-----+----------+--------+ DP       82                0.43 monophasic         +---------+------------------+-----+----------+--------+ Great Toe66                0.35 Abnormal           +---------+------------------+-----+----------+--------+ +---------+------------------+-----+----------+-------+ Left     Lt Pressure (mmHg)IndexWaveform  Comment +---------+------------------+-----+----------+-------+ Brachial 187                                      +---------+------------------+-----+----------+-------+ PTA      54                0.28 monophasic         +---------+------------------+-----+----------+-------+ DP       45                0.24 monophasic        +---------+------------------+-----+----------+-------+ Great Toe0                 0.00 Absent            +---------+------------------+-----+----------+-------+ +-------+-----------+-----------+------------+------------+ ABI/TBIToday's ABIToday's TBIPrevious ABIPrevious TBI +-------+-----------+-----------+------------+------------+ Right  0.49       0.35       1.03        0.65         +-------+-----------+-----------+------------+------------+ Left   0.28       0          0.68        0.29         +-------+-----------+-----------+------------+------------+   Summary: Right: Resting right ankle-brachial index indicates severe right lower extremity arterial disease. The right toe-brachial index is abnormal. Left: Resting left ankle-brachial index indicates critical left limb ischemia. The left toe-brachial index is abnormal/absent. Bilateral ABIs and TBIs appear decreased compared to prior 11/04/2019.  *See table(s) above for measurements and observations.  Electronically signed by Gerarda Fraction on 10/06/2023 at 1:45:02 PM.    Final     Labs:  CBC: No results for input(s): "WBC", "HGB", "HCT", "PLT" in the last 8760 hours.  COAGS: No results for input(s): "INR", "APTT" in the last 8760 hours.  BMP: No results for input(s): "NA", "K", "CL", "CO2", "GLUCOSE", "BUN", "CALCIUM", "CREATININE", "GFRNONAA", "GFRAA" in the last 8760 hours.  Invalid input(s): "CMP"  LIVER FUNCTION TESTS: No results for input(s): "BILITOT", "AST", "ALT", "ALKPHOS", "PROT", "ALBUMIN" in the last 8760 hours.  TUMOR MARKERS: No results for input(s): "AFPTM", "CEA", "CA199", "CHROMGRNA" in the last 8760 hours.  Assessment and Plan:  Assessment:  William Shields is a 67yo male  presenting with sub-acute, chronic and worsening left lower extremity resting pain, compatible with Rutherford 4-6 class  symptoms of CLI.    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   Non-invasive lower extremity exam and imaging work-up shows evidence of occluded  left iliac stent system, as well as left fem-pop disease.   I refreshed William Maske with discussion regarding the anatomy, pathology/pathophysiology, natural history, and prognosis of PAD/CLI, including the so called "fate of the leg.".  Informed consent regarding treatment strategies was performed which would possibly include medical management, [walking program], surgical strategy, and/or endovascular options, with risk/benefit discussion.  The indications for treatment supported by updated guidelines1, 2 were discussed.  We discussed options for treatment, which might be endovascular vs surgical.  He has a TASC D lesion on the left, so if no endo option is feasible, he will likely need either aorto-femoral bypass or extra-anatomic cross-over bypass.   After our discussion, patient has elected to proceed with endovascular attempt.  This is mainly due to his need to continue working without much time off.   Regarding endovascular options, specific risks discussed include: bleeding, infection, contrast reaction, renal injury/nephropathy, arterial injury/dissection, need for additional procedure/surgery, worsening symptoms/tissue including limb loss, cardiopulmonary collapse, death.    Regarding medical management, maximal medical therapy for reduction of risk factors is indicated as recommended by updated AHA guidelines1.  This includes anti-platelet medication, tight blood glucose control to a HbA1c < 7, tight blood pressure control, maximum-dose HMG-CoA reductase inhibitor, and smoking cessation.  Smoking cessation was addressed again.  Strategies including counseling and nicotine replacement.  He said that welbutrin and chantix made him angry/depressed, and does not want  to try them again.    Discussed today with Dr. Karin Lieu.   Plan:   -Plan is to proceed with aorto-peripheral angiogram and possible intervention, target left leg. Dr Loreta Ave at [facility].  -Recommend maximal medical therapy for cardiovascular risk reduction, including anti-platelet therapy. -Recommend initiating smoking cessation measures, with options discussed.       ___________________________________________________________________   1Monte Fantasia MD, et al. 2016 AHA/ACC Guideline on the Management of Patients With Lower Extremity Peripheral Artery Disease: Executive Summary: A Report of the American College of Cardiology/American Heart Association Task Force on Clinical Practice Guidelines. J Am Coll Cardiol. 2017 Mar 21;69(11):1465-1508. doi: 10.1016/j.jacc.2016.11.008.   2 - Norgren L, et al. TASC II Working Group. Inter-society consensus for the management of peripheral arterial disease. Int Nunzio Cobbs. 2007 Jun;26(2):81-157. Review. PubMed PMID: 16109604  3 - Hingorani A, et al. The management of diabetic foot: A clinical practice guideline by the Society for Vascular Surgery in collaboration with the American Podiatric Medical Association and the Society  for Vascular Medicine. J Vasc Surg. 2016 Feb;63(2 Suppl):3S-21S. doi: 10.1016/j.jvs.2015.10.003. PubMed PMID: 54098119.  4 - Luther Hearing, Saab FA, Elyse Jarvis, Danae Orleans, Deeann Cree, Driver VR, [place], Lookstein R, van den Tilman Neat, Jaff William, Reinaldo Raddle, Henao S, AlMahameed A, Katzen B. Digital Subtraction Angiography Prior to an Amputation for Critical Limb Ischemia (CLI): An Expert Recommendation Statement From the CLI Global Society to Optimize Limb Salvage. J Endovasc Ther. 2020 Aug;27(4):540-546. doi: 10.1177/1526602820928590. Epub 2020 May 29. PMID: 14782956.    Electronically Signed: Gilmer Mor, DO 10/07/2023, 5:03 PM   I spent a total of  60 Minutes   in face to face in clinical consultation, greater than 50% of which was  counseling/coordinating care for left leg CLTI, possible left lower extremity angiogram possible intervention

## 2023-10-20 ENCOUNTER — Other Ambulatory Visit: Payer: Self-pay

## 2023-10-20 DIAGNOSIS — I6529 Occlusion and stenosis of unspecified carotid artery: Secondary | ICD-10-CM

## 2023-10-21 ENCOUNTER — Other Ambulatory Visit (HOSPITAL_COMMUNITY): Payer: Self-pay | Admitting: Interventional Radiology

## 2023-10-21 ENCOUNTER — Telehealth (HOSPITAL_COMMUNITY): Payer: Self-pay | Admitting: Radiology

## 2023-10-21 DIAGNOSIS — I739 Peripheral vascular disease, unspecified: Secondary | ICD-10-CM

## 2023-10-21 NOTE — Telephone Encounter (Signed)
Called pt's wife, left VM for her to call back to schedule William Shields's LLE angio with Dr. Loreta Ave on 11/08/23 at 9 am. JM

## 2023-11-02 ENCOUNTER — Ambulatory Visit: Payer: Self-pay

## 2023-11-02 ENCOUNTER — Encounter (HOSPITAL_COMMUNITY): Payer: Self-pay

## 2023-11-02 ENCOUNTER — Ambulatory Visit: Payer: Self-pay | Admitting: Vascular Surgery

## 2023-11-07 ENCOUNTER — Other Ambulatory Visit: Payer: Self-pay | Admitting: Student

## 2023-11-07 DIAGNOSIS — I75022 Atheroembolism of left lower extremity: Secondary | ICD-10-CM

## 2023-11-07 DIAGNOSIS — I739 Peripheral vascular disease, unspecified: Secondary | ICD-10-CM

## 2023-11-07 NOTE — H&P (Signed)
 Chief Complaint: Severe claudication  Referring Provider(s): Theadora Rama, Corrina   Supervising Physician: Gilmer Mor  Patient Status: [name]  History of Present Illness: William Shields is a 68 y.o. male  with known PAD, previously treated for bilateral iliac disease, including left CIA/EIA stenting, now with worsening symptoms.     William Shields is known to our service from [facility], where I first treated him July 2019 for R>L lower extremity lifestyle limiting claudication. He then had interval presentation to [facility] 11/03/19 in the eD for left blue toe syndrome.  We treated him then with left iliac stenting of the CIA and EIA.  (8mm EIA, with 9mm CIA).  He did not follow up with [place] in the clinic at [facility] after that.    He tells me today that about 2-3 weeks ago he began having worsening left calf and thigh cramping while working and walking.  Particularly around [employer].  The pain does not completely resolve with rest, as he tells me that he has associated "numbness" and "pain" in the foot.  He denies any symptom of the right.  He denies any wounds.    He says he takes aspirin "sometimes".  He is not taking statin medication.  He is smoking again, after previously quitting.  He endorses 1ppd.  He takes BP medication, and DM medication.    He denies recent hospitalization.  He denies any known stroke, MI, or chest pain with activity.    Non invasive arterial study done 10/06/23: Right ABI: 0.49 Right TBI:0.35 Left ABI: 0.28 Left TBI: absent Duplex performed of the aorto-iliac segment shows a left iliac occlusion, with monphasic CFA waveform.     CTA 10/07/23= 1. Occlusion of the indwelling left common to external iliac artery stents. Otherwise patent left lower extremity outflow and 3 vessel runoff. 2. Long segment chronic total occlusion of the right superficial femoral artery from its proximal portion to the level of Hunter's canal. Moderate focal inflow stenosis about the  common iliac artery secondary to atherosclerotic plaque. Otherwise patent right lower extremity inflow and 3 vessel runoff.      Patient is Full Code  Past Medical History:  Diagnosis Date   Carotid artery occlusion    Diabetes mellitus without complication (HCC)    Hypertension    PAD (peripheral artery disease) (HCC)    Peripheral arterial disease (HCC)     Past Surgical History:  Procedure Laterality Date   IR ANGIOGRAM PELVIS SELECTIVE OR SUPRASELECTIVE  11/06/2019   IR ILIAC ART STENT INC PTA MOD SED  11/06/2019   IR RADIOLOGIST EVAL & MGMT  03/02/2018   IR RADIOLOGIST EVAL & MGMT  10/07/2023   IR US GUIDE VASC ACCESS RIGHT  11/06/2019    Allergies: Pollen extract  Medications: Prior to Admission medications   Medication Sig Start Date End Date Taking? Authorizing Provider  B Complex-C (SUPER B COMPLEX PO) Take 1 tablet by mouth at bedtime.    [provider]  Cholecalciferol (VITAMIN D-3) 125 MCG (5000 UT) TABS Take 5,000 Units by mouth daily.    [provider]  Coenzyme Q10 (COQ-10) 100 MG CAPS Take 100 mg by mouth daily.    [provider]  diphenhydrAMINE (BENADRYL) 25 MG tablet Take 25 mg by mouth daily as needed (congestion).    [provider]  glipiZIDE (GLUCOTROL XL) 5 MG 24 hr tablet Take 5 mg by mouth daily as needed (with meals that will increase blood sugar levels).  [provider]  losartan (COZAAR) 100 MG tablet Take 100 mg by mouth daily. 05/21/22   [provider]  losartan (COZAAR) 25 MG tablet Take 25 mg by mouth daily. 10/16/19   [provider]  metFORMIN (GLUCOPHAGE-XR) 500 MG 24 hr tablet Take 500 mg by mouth 2 (two) times daily with a meal.  01/28/18   [provider]  Multiple Vitamin (MULTIVITAMIN WITH MINERALS) TABS tablet Take 1 tablet by mouth once a week.    [provider]  oxymetazoline (AFRIN) 0.05 % nasal spray Place 1 spray into both nostrils 2 (two) times daily  as needed for congestion.    [provider]  tamsulosin (FLOMAX) 0.4 MG CAPS capsule Take 0.4 mg by mouth daily after lunch.  01/19/18   [provider]  vitamin E (VITAMIN E) 180 MG (400 UNITS) capsule Take 400 Units by mouth 4 (four) times a week.    [provider]     Family History  Problem Relation Age of Onset   Coronary artery disease Father    Microcephaly Father    Peripheral Artery Disease Sister    Hyperlipidemia Paternal Grandfather    CAD Paternal Grandfather     Social History   Socioeconomic History   Marital status: Married    Spouse name: Not on file   Number of children: Not on file   Years of education: Not on file   Highest education level: Not on file  Occupational History   Occupation: W  Tobacco Use   Smoking status: Every Day    Current packs/day: 0.50    Average packs/day: 0.5 packs/day for 40.0 years (20.0 ttl pk-yrs)    Types: Cigarettes   Smokeless tobacco: Never  Vaping Use   Vaping status: Never Used  Substance and Sexual Activity   Alcohol use: Yes    Comment: occs.   Drug use: Never   Sexual activity: Not on file  Other Topics Concern   Not on file  Social History Narrative   Not on file   Social Drivers of Health   Financial Resource Strain: Not on file  Food Insecurity: Not on file  Transportation Needs: Not on file  Physical Activity: Not on file  Stress: Not on file  Social Connections: Not on file     Review of Systems: A 12 point ROS discussed and pertinent positives are indicated in the HPI above.  All other systems are negative.  Review of Systems  Vital Signs: There were no vitals taken for this visit.  Advance Care Plan: The advanced care place/surrogate decision maker was discussed at the time of visit and the patient did not wish to discuss or was not able to name a surrogate decision maker or provide an advance care plan.  Physical Exam Vitals reviewed.  Constitutional:       Appearance: Normal appearance.  HENT:     Head: Normocephalic and atraumatic.  Eyes:     Extraocular Movements: Extraocular movements intact.  Cardiovascular:     Rate and Rhythm: Normal rate and regular rhythm.  Pulmonary:     Effort: Pulmonary effort is normal. No respiratory distress.     Breath sounds: Normal breath sounds.  Abdominal:     General: There is no distension.     Palpations: Abdomen is soft.     Tenderness: There is no abdominal tenderness.  Musculoskeletal:        General: Normal range of motion.     Cervical  back: Normal range of motion.  Skin:    General: Skin is warm and dry.  Neurological:     General: No focal deficit present.     Mental Status: He is alert and oriented to person, place, and time.  Psychiatric:        Mood and Affect: Mood normal.        Behavior: Behavior normal.        Thought Content: Thought content normal.        Judgment: Judgment normal.     Imaging: No results found.  Labs:  CBC: No results for input(s): "WBC", "HGB", "HCT", "PLT" in the last 8760 hours.  COAGS: No results for input(s): "INR", "APTT" in the last 8760 hours.  BMP: No results for input(s): "NA", "K", "CL", "CO2", "GLUCOSE", "BUN", "CALCIUM", "CREATININE", "GFRNONAA", "GFRAA" in the last 8760 hours.  Invalid input(s): "CMP"  LIVER FUNCTION TESTS: No results for input(s): "BILITOT", "AST", "ALT", "ALKPHOS", "PROT", "ALBUMIN" in the last 8760 hours.  TUMOR MARKERS: No results for input(s): "AFPTM", "CEA", "CA199", "CHROMGRNA" in the last 8760 hours.  Assessment and Plan:  Chronic and worsening left lower extremity resting pain, compatible with Rutherford 4-6 class symptoms of CLI.     Non-invasive lower extremity exam and imaging work-up shows evidence of occluded left iliac stent system, as well as left fem-pop disease.    Will proceed with aortic and lower extremity angiogram and possible intervention, target left leg today by Dr. Loreta Ave.  Risks  and benefits of lower extremity angiography/revascularization were discussed with the patient including, but not limited to bleeding, infection, vascular injury or contrast induced renal failure.  This interventional procedure involves the use of X-rays and because of the nature of the planned procedure, it is possible that we will have prolonged use of X-ray fluoroscopy.  Potential radiation risks to you include (but are not limited to) the following: - A slightly elevated risk for cancer  several years later in life. This risk is typically less than 0.5% percent. This risk is low in comparison to the normal incidence of human cancer, which is 33% for women and 50% for men according to the [employer] Society. - Radiation induced injury can include skin redness, resembling a rash, tissue breakdown / ulcers and hair loss (which can be temporary or permanent).   The likelihood of either of these occurring depends on the difficulty of the procedure and whether you are sensitive to radiation due to previous procedures, disease, or genetic conditions.   IF your procedure requires a prolonged use of radiation, you will be notified and given written instructions for further action.  It is your responsibility to monitor the irradiated area for the 2 weeks following the procedure and to notify your physician if you are concerned that you have suffered a radiation induced injury.    All of the patient's questions were answered, patient is agreeable to proceed.  Consent signed and in chart.  Thank you for allowing our service to participate in Lan Dodson 's care.  Electronically Signed: Gwynneth Macleod, PA-C   11/07/2023, 10:39 AM      I spent a total of    25 Minutes in face to face in clinical consultation, greater than 50% of which was counseling/coordinating care for aortogram with runoff and possible angioplasty left leg.  (A copy of this note was sent to the referring provider and the  time of visit.)

## 2023-11-08 ENCOUNTER — Other Ambulatory Visit: Payer: Self-pay

## 2023-11-08 ENCOUNTER — Other Ambulatory Visit (HOSPITAL_COMMUNITY): Payer: Self-pay | Admitting: Interventional Radiology

## 2023-11-08 ENCOUNTER — Encounter (HOSPITAL_COMMUNITY): Payer: Self-pay

## 2023-11-08 ENCOUNTER — Other Ambulatory Visit: Payer: Self-pay | Admitting: Physician Assistant

## 2023-11-08 ENCOUNTER — Ambulatory Visit (HOSPITAL_COMMUNITY)
Admission: RE | Admit: 2023-11-08 | Discharge: 2023-11-08 | Disposition: A | Discharge status: Home / Self Care | Payer: Self-pay | Source: Ambulatory Visit | Attending: Interventional Radiology | Admitting: Interventional Radiology

## 2023-11-08 DIAGNOSIS — I739 Peripheral vascular disease, unspecified: Secondary | ICD-10-CM

## 2023-11-08 DIAGNOSIS — Z7984 Long term (current) use of oral hypoglycemic drugs: Secondary | ICD-10-CM | POA: Insufficient documentation

## 2023-11-08 DIAGNOSIS — E119 Type 2 diabetes mellitus without complications: Secondary | ICD-10-CM | POA: Insufficient documentation

## 2023-11-08 DIAGNOSIS — I75022 Atheroembolism of left lower extremity: Secondary | ICD-10-CM

## 2023-11-08 DIAGNOSIS — I10 Essential (primary) hypertension: Secondary | ICD-10-CM | POA: Insufficient documentation

## 2023-11-08 DIAGNOSIS — I70212 Atherosclerosis of native arteries of extremities with intermittent claudication, left leg: Secondary | ICD-10-CM | POA: Insufficient documentation

## 2023-11-08 DIAGNOSIS — Z8249 Family history of ischemic heart disease and other diseases of the circulatory system: Secondary | ICD-10-CM | POA: Insufficient documentation

## 2023-11-08 DIAGNOSIS — F17210 Nicotine dependence, cigarettes, uncomplicated: Secondary | ICD-10-CM | POA: Insufficient documentation

## 2023-11-08 DIAGNOSIS — I7092 Chronic total occlusion of artery of the extremities: Secondary | ICD-10-CM | POA: Insufficient documentation

## 2023-11-08 HISTORY — PX: IR AORTAGRAM ABDOMINAL SERIALOGRAM: IMG636

## 2023-11-08 HISTORY — PX: IR ANGIOGRAM EXTREMITY LEFT: IMG651

## 2023-11-08 HISTORY — PX: IR US GUIDE VASC ACCESS RIGHT: IMG2390

## 2023-11-08 HISTORY — PX: IR US GUIDE VASC ACCESS LEFT: IMG2389

## 2023-11-08 HISTORY — PX: IR ILIAC ART STENT INC PTA MOD SED: IMG2306

## 2023-11-08 LAB — CBC WITH DIFFERENTIAL/PLATELET
Abs Immature Granulocytes: 0.04 10*3/uL (ref 0.00–0.07)
Basophils Absolute: 0.1 10*3/uL (ref 0.0–0.1)
Basophils Relative: 1 %
Eosinophils Absolute: 0.2 10*3/uL (ref 0.0–0.5)
Eosinophils Relative: 2 %
HCT: 41.2 % (ref 39.0–52.0)
Hemoglobin: 13.7 g/dL (ref 13.0–17.0)
Immature Granulocytes: 0 %
Lymphocytes Relative: 16 %
Lymphs Abs: 1.5 10*3/uL (ref 0.7–4.0)
MCH: 31.6 pg (ref 26.0–34.0)
MCHC: 33.3 g/dL (ref 30.0–36.0)
MCV: 94.9 fL (ref 80.0–100.0)
Monocytes Absolute: 0.8 10*3/uL (ref 0.1–1.0)
Monocytes Relative: 8 %
Neutro Abs: 6.8 10*3/uL (ref 1.7–7.7)
Neutrophils Relative %: 73 %
Platelets: 224 10*3/uL (ref 150–400)
RBC: 4.34 MIL/uL (ref 4.22–5.81)
RDW: 12.6 % (ref 11.5–15.5)
WBC: 9.3 10*3/uL (ref 4.0–10.5)
nRBC: 0 % (ref 0.0–0.2)

## 2023-11-08 LAB — BASIC METABOLIC PANEL
Anion gap: 5 (ref 5–15)
BUN: 24 mg/dL — ABNORMAL HIGH (ref 8–23)
CO2: 24 mmol/L (ref 22–32)
Calcium: 9.4 mg/dL (ref 8.9–10.3)
Chloride: 109 mmol/L (ref 98–111)
Creatinine, Ser: 1.15 mg/dL (ref 0.61–1.24)
GFR, Estimated: >60 mL/min (ref >60)
Glucose, Bld: 160 mg/dL — ABNORMAL HIGH (ref 70–99)
Potassium: 4.5 mmol/L (ref 3.5–5.1)
Sodium: 138 mmol/L (ref 135–145)

## 2023-11-08 LAB — PROTIME-INR
INR: 1 (ref 0.8–1.2)
Prothrombin Time: 13.2 s (ref 11.4–15.2)

## 2023-11-08 LAB — GLUCOSE, CAPILLARY
Glucose-Capillary: 155 mg/dL — ABNORMAL HIGH (ref 70–99)
Glucose-Capillary: 141 mg/dL — ABNORMAL HIGH (ref 70–99)

## 2023-11-08 MED ORDER — CLOPIDOGREL BISULFATE 75 MG PO TABS: 75.0000 mg | ORAL_TABLET | Freq: Every day | ORAL | 0 refills | Status: AC

## 2023-11-08 MED ORDER — CLOPIDOGREL BISULFATE 75 MG PO TABS
300.0000 mg | ORAL_TABLET | Freq: Once | ORAL | Status: AC
  Administered 2023-11-08: 300 mg via ORAL

## 2023-11-08 MED ORDER — MIDAZOLAM HCL 2 MG/2ML IJ SOLN
INTRAMUSCULAR | Status: AC | PRN
Start: 2023-11-08 — End: 2023-11-08
  Administered 2023-11-08: .5 mg via INTRAVENOUS
  Administered 2023-11-08: 1 mg via INTRAVENOUS
  Administered 2023-11-08: .5 mg via INTRAVENOUS

## 2023-11-08 MED ORDER — LIDOCAINE HCL 1 % IJ SOLN
INTRAMUSCULAR | Status: AC
  Filled 2023-11-08: qty 20

## 2023-11-08 MED ORDER — CEFAZOLIN SODIUM-DEXTROSE 2-4 GM/100ML-% IV SOLN
INTRAVENOUS | Status: AC | PRN
  Administered 2023-11-08: 2 g via INTRAVENOUS

## 2023-11-08 MED ORDER — CEFAZOLIN SODIUM-DEXTROSE 2-4 GM/100ML-% IV SOLN
INTRAVENOUS | Status: AC
  Filled 2023-11-08: qty 100

## 2023-11-08 MED ORDER — ASPIRIN 325 MG PO TABS
650.0000 mg | ORAL_TABLET | Freq: Once | ORAL | Status: AC
  Administered 2023-11-08: 650 mg via ORAL

## 2023-11-08 MED ORDER — SODIUM CHLORIDE 0.9 % IV SOLN: INTRAVENOUS | Status: DC

## 2023-11-08 MED ORDER — FENTANYL CITRATE (PF) 100 MCG/2ML IJ SOLN
INTRAMUSCULAR | Status: AC
  Filled 2023-11-08: qty 4

## 2023-11-08 MED ORDER — MIDAZOLAM HCL 2 MG/2ML IJ SOLN
INTRAMUSCULAR | Status: AC
  Filled 2023-11-08: qty 4

## 2023-11-08 MED ORDER — ASPIRIN 325 MG PO TABS
ORAL_TABLET | ORAL | Status: AC
  Filled 2023-11-08: qty 2

## 2023-11-08 MED ORDER — FENTANYL CITRATE (PF) 100 MCG/2ML IJ SOLN
INTRAMUSCULAR | Status: AC | PRN
  Administered 2023-11-08: 50 ug via INTRAVENOUS
  Administered 2023-11-08 (×3): 25 ug via INTRAVENOUS

## 2023-11-08 MED ORDER — IODIXANOL 320 MG/ML IV SOLN
100.0000 mL | Freq: Once | INTRAVENOUS | Status: AC | PRN
  Administered 2023-11-08: 40 mL via INTRAVENOUS

## 2023-11-08 MED ORDER — HEPARIN SODIUM (PORCINE) 1000 UNIT/ML IJ SOLN
INTRAMUSCULAR | Status: AC
  Filled 2023-11-08: qty 10

## 2023-11-08 MED ORDER — LACTATED RINGERS IV BOLUS
INTRAVENOUS | Status: AC | PRN
  Administered 2023-11-08: 500 mL via INTRAVENOUS

## 2023-11-08 MED ORDER — CLOPIDOGREL BISULFATE 300 MG PO TABS
ORAL_TABLET | ORAL | Status: AC
  Filled 2023-11-08: qty 1

## 2023-11-08 MED ORDER — CEFAZOLIN SODIUM-DEXTROSE 2-4 GM/100ML-% IV SOLN: 2.0000 g | INTRAVENOUS | Status: DC

## 2023-11-08 MED ORDER — HEPARIN SODIUM (PORCINE) 1000 UNIT/ML IJ SOLN
INTRAMUSCULAR | Status: AC | PRN
  Administered 2023-11-08: 5000 [IU] via INTRAVENOUS
  Administered 2023-11-08: 2000 [IU] via INTRAVENOUS
  Administered 2023-11-08: 1000 [IU] via INTRAVENOUS

## 2023-11-08 MED ORDER — IODIXANOL 320 MG/ML IV SOLN
100.0000 mL | Freq: Once | INTRAVENOUS | Status: AC | PRN
  Administered 2023-11-08: 30 mL via INTRAVENOUS

## 2023-11-08 NOTE — Procedures (Signed)
 Interventional Radiology Procedure Note  Procedure:   US guided access bilateral CFA.  Aortoperipheral angiogram, LLE Pressure measurement aorta --> RCFA Revasc of occluded left CIA/EIA with PTA and covered stents CELT for hemostasis.  Findings: Start --> Occluded (100% stenosis) left iliac system Finish --> Resolved occlusion with 0% residual  EBL: ~100cc  Complications: None  Recommendations:  - bilateral hip straight x 90 minutes during sedation recovery - 90 minutes recovery sedation - plan for DC home in 90 minutes - DAPT starting tomorrow, 81mg  ASA daily, 75mg  plavix daily - advance diet - routine wound care - follow up with Dr. Loreta Ave in clinic in ~4 weeks  Signed,  Yvone Neu. Loreta Ave, DO, ABVM, RPVI

## 2023-11-08 NOTE — Sedation Documentation (Signed)
Stent deployed

## 2023-11-11 ENCOUNTER — Other Ambulatory Visit: Payer: Self-pay | Admitting: Interventional Radiology

## 2023-11-11 DIAGNOSIS — I739 Peripheral vascular disease, unspecified: Secondary | ICD-10-CM

## 2023-12-19 NOTE — Addendum Note (Signed)
 Encounter addended by: Wolfgang Hawthorn, RT on: 12/19/2023 12:03 PM  Actions taken: Imaging Exam ended

## 2023-12-19 NOTE — Addendum Note (Signed)
 Encounter addended by: Wolfgang Hawthorn, RT on: 12/19/2023 11:06 AM  Actions taken: Imaging Exam ended

## 2023-12-19 NOTE — Addendum Note (Signed)
 Encounter addended by: Wolfgang Hawthorn, RT on: 12/19/2023 7:53 AM  Actions taken: Imaging Exam ended

## 2023-12-20 NOTE — Addendum Note (Signed)
 Encounter addended by: Wolfgang Hawthorn, RT on: 12/20/2023 1:31 PM  Actions taken: Imaging Exam ended

## 2023-12-20 NOTE — Addendum Note (Signed)
 Encounter addended by: Zyla Dascenzo H on: 12/20/2023 2:10 PM  Actions taken: SmartForm saved

## 2023-12-20 NOTE — Addendum Note (Signed)
 Encounter addended by: Wolfgang Hawthorn, RT on: 12/20/2023 7:44 AM  Actions taken: Imaging Exam ended
